# Patient Record
Sex: Male | Born: 1980 | Race: White | Hispanic: No | Smoking: Current every day smoker
Health system: Southern US, Community
[De-identification: ages and names within clinical notes are randomized; demographics above are authoritative.]

## PROBLEM LIST (undated history)

## (undated) DIAGNOSIS — F410 Panic disorder [episodic paroxysmal anxiety] without agoraphobia: Secondary | ICD-10-CM

## (undated) DIAGNOSIS — M1990 Unspecified osteoarthritis, unspecified site: Secondary | ICD-10-CM

## (undated) DIAGNOSIS — F1994 Other psychoactive substance use, unspecified with psychoactive substance-induced mood disorder: Secondary | ICD-10-CM

## (undated) DIAGNOSIS — F319 Bipolar disorder, unspecified: Secondary | ICD-10-CM

## (undated) DIAGNOSIS — K257 Chronic gastric ulcer without hemorrhage or perforation: Secondary | ICD-10-CM

## (undated) DIAGNOSIS — G8929 Other chronic pain: Secondary | ICD-10-CM

## (undated) DIAGNOSIS — F32A Depression, unspecified: Secondary | ICD-10-CM

## (undated) DIAGNOSIS — F329 Major depressive disorder, single episode, unspecified: Secondary | ICD-10-CM

## (undated) DIAGNOSIS — F419 Anxiety disorder, unspecified: Secondary | ICD-10-CM

## (undated) DIAGNOSIS — M549 Dorsalgia, unspecified: Secondary | ICD-10-CM

## (undated) HISTORY — PX: OTHER SURGICAL HISTORY: SHX169

---

## 1998-05-29 ENCOUNTER — Emergency Department (HOSPITAL_COMMUNITY): Admission: EM | Admit: 1998-05-29 | Discharge: 1998-05-29 | Payer: Self-pay | Admitting: Emergency Medicine

## 1998-07-25 ENCOUNTER — Emergency Department (HOSPITAL_COMMUNITY): Admission: EM | Admit: 1998-07-25 | Discharge: 1998-07-25 | Payer: Self-pay | Admitting: Emergency Medicine

## 2000-05-16 ENCOUNTER — Encounter: Payer: Self-pay | Admitting: Family Medicine

## 2000-05-16 ENCOUNTER — Encounter: Admission: RE | Admit: 2000-05-16 | Discharge: 2000-05-16 | Payer: Self-pay | Admitting: Family Medicine

## 2003-04-26 ENCOUNTER — Emergency Department (HOSPITAL_COMMUNITY): Admission: EM | Admit: 2003-04-26 | Discharge: 2003-04-26 | Payer: Self-pay | Admitting: Emergency Medicine

## 2003-05-29 ENCOUNTER — Emergency Department (HOSPITAL_COMMUNITY): Admission: EM | Admit: 2003-05-29 | Discharge: 2003-05-29 | Payer: Self-pay | Admitting: Emergency Medicine

## 2003-11-15 ENCOUNTER — Emergency Department (HOSPITAL_COMMUNITY): Admission: EM | Admit: 2003-11-15 | Discharge: 2003-11-15 | Payer: Self-pay | Admitting: Emergency Medicine

## 2003-12-30 ENCOUNTER — Emergency Department (HOSPITAL_COMMUNITY): Admission: EM | Admit: 2003-12-30 | Discharge: 2003-12-31 | Payer: Self-pay | Admitting: Emergency Medicine

## 2004-01-10 ENCOUNTER — Emergency Department (HOSPITAL_COMMUNITY): Admission: EM | Admit: 2004-01-10 | Discharge: 2004-01-10 | Payer: Self-pay | Admitting: Emergency Medicine

## 2004-01-21 ENCOUNTER — Encounter: Admission: RE | Admit: 2004-01-21 | Discharge: 2004-01-21 | Payer: Self-pay | Admitting: Psychiatry

## 2004-01-21 ENCOUNTER — Emergency Department (HOSPITAL_COMMUNITY): Admission: EM | Admit: 2004-01-21 | Discharge: 2004-01-21 | Payer: Self-pay | Admitting: Family Medicine

## 2004-01-31 ENCOUNTER — Emergency Department (HOSPITAL_COMMUNITY): Admission: EM | Admit: 2004-01-31 | Discharge: 2004-01-31 | Payer: Self-pay | Admitting: Emergency Medicine

## 2004-02-07 ENCOUNTER — Emergency Department (HOSPITAL_COMMUNITY): Admission: EM | Admit: 2004-02-07 | Discharge: 2004-02-07 | Payer: Self-pay | Admitting: Emergency Medicine

## 2004-02-25 ENCOUNTER — Ambulatory Visit (HOSPITAL_COMMUNITY): Payer: Self-pay | Admitting: Psychiatry

## 2004-03-01 ENCOUNTER — Ambulatory Visit (HOSPITAL_COMMUNITY): Payer: Self-pay | Admitting: Professional Counselor

## 2004-03-08 ENCOUNTER — Ambulatory Visit (HOSPITAL_COMMUNITY): Payer: Self-pay | Admitting: Professional Counselor

## 2004-03-22 ENCOUNTER — Ambulatory Visit (HOSPITAL_COMMUNITY): Payer: Self-pay | Admitting: Professional Counselor

## 2004-03-29 ENCOUNTER — Ambulatory Visit (HOSPITAL_COMMUNITY): Payer: Self-pay | Admitting: Psychiatry

## 2004-05-02 ENCOUNTER — Emergency Department (HOSPITAL_COMMUNITY): Admission: EM | Admit: 2004-05-02 | Discharge: 2004-05-02 | Payer: Self-pay | Admitting: Emergency Medicine

## 2005-11-07 ENCOUNTER — Emergency Department (HOSPITAL_COMMUNITY): Admission: EM | Admit: 2005-11-07 | Discharge: 2005-11-07 | Payer: Self-pay | Admitting: Emergency Medicine

## 2007-04-02 ENCOUNTER — Emergency Department (HOSPITAL_COMMUNITY): Admission: EM | Admit: 2007-04-02 | Discharge: 2007-04-02 | Payer: Self-pay | Admitting: Emergency Medicine

## 2007-04-18 ENCOUNTER — Emergency Department (HOSPITAL_COMMUNITY): Admission: EM | Admit: 2007-04-18 | Discharge: 2007-04-18 | Payer: Self-pay | Admitting: Emergency Medicine

## 2007-04-21 ENCOUNTER — Encounter (INDEPENDENT_AMBULATORY_CARE_PROVIDER_SITE_OTHER): Payer: Self-pay | Admitting: Nurse Practitioner

## 2007-04-21 DIAGNOSIS — E119 Type 2 diabetes mellitus without complications: Secondary | ICD-10-CM | POA: Insufficient documentation

## 2007-04-21 DIAGNOSIS — F411 Generalized anxiety disorder: Secondary | ICD-10-CM | POA: Insufficient documentation

## 2007-04-21 LAB — CONVERTED CEMR LAB
AST: 17 units/L
Alkaline Phosphatase: 52 units/L
BUN: 20 mg/dL
Cholesterol: 203 mg/dL
Creatinine, Ser: 0.89 mg/dL
HDL: 40 mg/dL
LDL Cholesterol: 139 mg/dL
Platelets: 326 10*3/uL
Potassium: 4.9 meq/L
RDW: 14.1 %
TSH: 3.447 microintl units/mL
Total Bilirubin: 0.4 mg/dL

## 2007-07-07 ENCOUNTER — Emergency Department (HOSPITAL_COMMUNITY): Admission: EM | Admit: 2007-07-07 | Discharge: 2007-07-07 | Payer: Self-pay | Admitting: Emergency Medicine

## 2007-08-15 DIAGNOSIS — S53449A Ulnar collateral ligament sprain of unspecified elbow, initial encounter: Secondary | ICD-10-CM

## 2007-09-08 ENCOUNTER — Emergency Department (HOSPITAL_COMMUNITY): Admission: EM | Admit: 2007-09-08 | Discharge: 2007-09-08 | Payer: Self-pay | Admitting: Emergency Medicine

## 2007-09-10 ENCOUNTER — Emergency Department (HOSPITAL_COMMUNITY): Admission: EM | Admit: 2007-09-10 | Discharge: 2007-09-10 | Payer: Self-pay | Admitting: Emergency Medicine

## 2007-09-30 ENCOUNTER — Emergency Department (HOSPITAL_COMMUNITY): Admission: EM | Admit: 2007-09-30 | Discharge: 2007-09-30 | Payer: Self-pay | Admitting: Emergency Medicine

## 2007-10-08 ENCOUNTER — Ambulatory Visit: Payer: Self-pay | Admitting: Nurse Practitioner

## 2007-10-08 DIAGNOSIS — M719 Bursopathy, unspecified: Secondary | ICD-10-CM

## 2007-10-08 DIAGNOSIS — Z8669 Personal history of other diseases of the nervous system and sense organs: Secondary | ICD-10-CM | POA: Insufficient documentation

## 2007-10-08 DIAGNOSIS — M67919 Unspecified disorder of synovium and tendon, unspecified shoulder: Secondary | ICD-10-CM | POA: Insufficient documentation

## 2007-10-09 ENCOUNTER — Encounter (INDEPENDENT_AMBULATORY_CARE_PROVIDER_SITE_OTHER): Payer: Self-pay | Admitting: Nurse Practitioner

## 2007-10-10 DIAGNOSIS — F341 Dysthymic disorder: Secondary | ICD-10-CM

## 2007-10-14 ENCOUNTER — Encounter (INDEPENDENT_AMBULATORY_CARE_PROVIDER_SITE_OTHER): Payer: Self-pay | Admitting: Nurse Practitioner

## 2007-10-22 ENCOUNTER — Telehealth (INDEPENDENT_AMBULATORY_CARE_PROVIDER_SITE_OTHER): Payer: Self-pay | Admitting: Nurse Practitioner

## 2007-11-21 ENCOUNTER — Ambulatory Visit: Payer: Self-pay | Admitting: Internal Medicine

## 2007-11-21 DIAGNOSIS — S43499A Other sprain of unspecified shoulder joint, initial encounter: Secondary | ICD-10-CM

## 2007-11-21 DIAGNOSIS — S46819A Strain of other muscles, fascia and tendons at shoulder and upper arm level, unspecified arm, initial encounter: Secondary | ICD-10-CM

## 2008-03-16 ENCOUNTER — Emergency Department (HOSPITAL_COMMUNITY): Admission: EM | Admit: 2008-03-16 | Discharge: 2008-03-16 | Payer: Self-pay | Admitting: Emergency Medicine

## 2008-03-18 ENCOUNTER — Emergency Department (HOSPITAL_COMMUNITY): Admission: EM | Admit: 2008-03-18 | Discharge: 2008-03-18 | Payer: Self-pay | Admitting: Emergency Medicine

## 2008-06-30 ENCOUNTER — Emergency Department (HOSPITAL_COMMUNITY): Admission: EM | Admit: 2008-06-30 | Discharge: 2008-06-30 | Payer: Self-pay | Admitting: Emergency Medicine

## 2008-10-29 ENCOUNTER — Emergency Department (HOSPITAL_COMMUNITY): Admission: EM | Admit: 2008-10-29 | Discharge: 2008-10-29 | Payer: Self-pay | Admitting: Emergency Medicine

## 2009-01-09 ENCOUNTER — Emergency Department (HOSPITAL_COMMUNITY): Admission: EM | Admit: 2009-01-09 | Discharge: 2009-01-09 | Payer: Self-pay | Admitting: Emergency Medicine

## 2009-08-11 ENCOUNTER — Emergency Department (HOSPITAL_BASED_OUTPATIENT_CLINIC_OR_DEPARTMENT_OTHER): Admission: EM | Admit: 2009-08-11 | Discharge: 2009-08-11 | Payer: Self-pay | Admitting: Emergency Medicine

## 2009-08-16 ENCOUNTER — Emergency Department (HOSPITAL_BASED_OUTPATIENT_CLINIC_OR_DEPARTMENT_OTHER): Admission: EM | Admit: 2009-08-16 | Discharge: 2009-08-16 | Payer: Self-pay | Admitting: Emergency Medicine

## 2010-01-11 ENCOUNTER — Emergency Department (HOSPITAL_BASED_OUTPATIENT_CLINIC_OR_DEPARTMENT_OTHER): Admission: EM | Admit: 2010-01-11 | Discharge: 2010-01-11 | Payer: Self-pay | Admitting: Emergency Medicine

## 2010-03-06 ENCOUNTER — Ambulatory Visit: Payer: Self-pay | Admitting: Diagnostic Radiology

## 2010-03-06 ENCOUNTER — Emergency Department (HOSPITAL_BASED_OUTPATIENT_CLINIC_OR_DEPARTMENT_OTHER): Admission: EM | Admit: 2010-03-06 | Discharge: 2010-03-06 | Payer: Self-pay | Admitting: Emergency Medicine

## 2010-03-20 ENCOUNTER — Emergency Department (HOSPITAL_COMMUNITY): Admission: EM | Admit: 2010-03-20 | Discharge: 2010-03-20 | Payer: Self-pay | Admitting: Emergency Medicine

## 2010-03-30 ENCOUNTER — Emergency Department (HOSPITAL_BASED_OUTPATIENT_CLINIC_OR_DEPARTMENT_OTHER): Admission: EM | Admit: 2010-03-30 | Discharge: 2010-03-30 | Payer: Self-pay | Admitting: Emergency Medicine

## 2010-04-08 ENCOUNTER — Ambulatory Visit: Payer: Self-pay | Admitting: Diagnostic Radiology

## 2010-04-08 ENCOUNTER — Emergency Department (HOSPITAL_BASED_OUTPATIENT_CLINIC_OR_DEPARTMENT_OTHER): Admission: EM | Admit: 2010-04-08 | Discharge: 2010-04-08 | Payer: Self-pay | Admitting: Emergency Medicine

## 2010-04-08 ENCOUNTER — Emergency Department (HOSPITAL_COMMUNITY): Admission: EM | Admit: 2010-04-08 | Discharge: 2010-04-08 | Payer: Self-pay | Admitting: Emergency Medicine

## 2010-04-26 ENCOUNTER — Emergency Department (HOSPITAL_COMMUNITY)
Admission: EM | Admit: 2010-04-26 | Discharge: 2010-04-26 | Payer: Self-pay | Source: Home / Self Care | Admitting: Emergency Medicine

## 2010-04-29 ENCOUNTER — Emergency Department (HOSPITAL_COMMUNITY)
Admission: EM | Admit: 2010-04-29 | Discharge: 2010-04-29 | Payer: Self-pay | Source: Home / Self Care | Admitting: Emergency Medicine

## 2010-05-20 ENCOUNTER — Emergency Department (HOSPITAL_COMMUNITY)
Admission: EM | Admit: 2010-05-20 | Discharge: 2010-05-20 | Payer: Self-pay | Source: Home / Self Care | Admitting: Emergency Medicine

## 2010-05-25 ENCOUNTER — Emergency Department (HOSPITAL_COMMUNITY): Admission: EM | Admit: 2010-05-25 | Discharge: 2010-04-27 | Payer: Self-pay | Admitting: Emergency Medicine

## 2010-08-30 LAB — COMPREHENSIVE METABOLIC PANEL
ALT: 21 U/L (ref 0–53)
AST: 18 U/L (ref 0–37)
CO2: 26 mEq/L (ref 19–32)
Calcium: 9.3 mg/dL (ref 8.4–10.5)
Chloride: 106 mEq/L (ref 96–112)
GFR calc Af Amer: >60 mL/min (ref >60)
GFR calc non Af Amer: >60 mL/min (ref >60)
Potassium: 3.3 mEq/L — ABNORMAL LOW (ref 3.5–5.1)
Sodium: 137 mEq/L (ref 135–145)
Total Bilirubin: 0.6 mg/dL (ref 0.3–1.2)

## 2010-08-30 LAB — BASIC METABOLIC PANEL
CO2: 26 mEq/L (ref 19–32)
Calcium: 10 mg/dL (ref 8.4–10.5)
Creatinine, Ser: 1.1 mg/dL (ref 0.4–1.5)
GFR calc Af Amer: >60 mL/min (ref >60)

## 2010-08-30 LAB — URINALYSIS, ROUTINE W REFLEX MICROSCOPIC
Bilirubin Urine: NEGATIVE
Glucose, UA: NEGATIVE mg/dL
Hgb urine dipstick: NEGATIVE
Ketones, ur: NEGATIVE mg/dL
pH: 6 (ref 5.0–8.0)

## 2010-08-30 LAB — CBC
Hemoglobin: 15.3 g/dL (ref 13.0–17.0)
MCH: 34 pg (ref 26.0–34.0)
MCHC: 34.3 g/dL (ref 30.0–36.0)
Platelets: 296 10*3/uL (ref 150–400)
RBC: 4.53 MIL/uL (ref 4.22–5.81)
RBC: 4.71 MIL/uL (ref 4.22–5.81)
WBC: 11.5 10*3/uL — ABNORMAL HIGH (ref 4.0–10.5)

## 2010-08-30 LAB — POCT CARDIAC MARKERS

## 2010-08-30 LAB — DIFFERENTIAL
Eosinophils Absolute: 0.1 10*3/uL (ref 0.0–0.7)
Eosinophils Relative: 1 % (ref 0–5)
Lymphs Abs: 3.1 10*3/uL (ref 0.7–4.0)

## 2010-08-31 LAB — BASIC METABOLIC PANEL
BUN: 14 mg/dL (ref 6–23)
Chloride: 109 mEq/L (ref 96–112)
Glucose, Bld: 102 mg/dL — ABNORMAL HIGH (ref 70–99)
Potassium: 3.5 mEq/L (ref 3.5–5.1)

## 2010-08-31 LAB — URINALYSIS, ROUTINE W REFLEX MICROSCOPIC
Bilirubin Urine: NEGATIVE
Glucose, UA: 100 mg/dL — AB
Ketones, ur: 40 mg/dL — AB
pH: 7 (ref 5.0–8.0)

## 2010-12-21 ENCOUNTER — Other Ambulatory Visit (HOSPITAL_COMMUNITY): Payer: Self-pay | Admitting: Internal Medicine

## 2010-12-21 DIAGNOSIS — M545 Low back pain: Secondary | ICD-10-CM

## 2010-12-25 ENCOUNTER — Emergency Department (HOSPITAL_COMMUNITY)
Admission: EM | Admit: 2010-12-25 | Discharge: 2010-12-25 | Disposition: A | Discharge status: Home / Self Care | Payer: Self-pay | Attending: Emergency Medicine | Admitting: Emergency Medicine

## 2010-12-25 DIAGNOSIS — Z888 Allergy status to other drugs, medicaments and biological substances status: Secondary | ICD-10-CM | POA: Insufficient documentation

## 2010-12-25 DIAGNOSIS — M549 Dorsalgia, unspecified: Secondary | ICD-10-CM | POA: Insufficient documentation

## 2010-12-25 DIAGNOSIS — G8929 Other chronic pain: Secondary | ICD-10-CM | POA: Insufficient documentation

## 2010-12-25 DIAGNOSIS — T40605A Adverse effect of unspecified narcotics, initial encounter: Secondary | ICD-10-CM | POA: Insufficient documentation

## 2010-12-25 DIAGNOSIS — F411 Generalized anxiety disorder: Secondary | ICD-10-CM | POA: Insufficient documentation

## 2010-12-26 ENCOUNTER — Other Ambulatory Visit (HOSPITAL_COMMUNITY): Payer: Self-pay | Admitting: Internal Medicine

## 2010-12-26 ENCOUNTER — Ambulatory Visit (HOSPITAL_COMMUNITY)
Admission: RE | Admit: 2010-12-26 | Discharge: 2010-12-26 | Disposition: A | Discharge status: Home / Self Care | Payer: Self-pay | Source: Ambulatory Visit | Attending: Internal Medicine | Admitting: Internal Medicine

## 2010-12-26 DIAGNOSIS — R609 Edema, unspecified: Secondary | ICD-10-CM | POA: Insufficient documentation

## 2010-12-26 DIAGNOSIS — M545 Low back pain, unspecified: Secondary | ICD-10-CM | POA: Insufficient documentation

## 2010-12-26 DIAGNOSIS — M79609 Pain in unspecified limb: Secondary | ICD-10-CM | POA: Insufficient documentation

## 2010-12-26 MED ORDER — GADOBENATE DIMEGLUMINE 529 MG/ML IV SOLN
15.0000 mL | Freq: Once | INTRAVENOUS | Status: AC
  Administered 2010-12-26: 15 mL via INTRAVENOUS

## 2011-02-03 ENCOUNTER — Emergency Department (HOSPITAL_BASED_OUTPATIENT_CLINIC_OR_DEPARTMENT_OTHER)
Admission: EM | Admit: 2011-02-03 | Discharge: 2011-02-03 | Disposition: A | Discharge status: Home / Self Care | Payer: Self-pay | Attending: Emergency Medicine | Admitting: Emergency Medicine

## 2011-02-03 ENCOUNTER — Encounter: Payer: Self-pay | Admitting: *Deleted

## 2011-02-03 DIAGNOSIS — G8929 Other chronic pain: Secondary | ICD-10-CM | POA: Insufficient documentation

## 2011-02-03 DIAGNOSIS — F17200 Nicotine dependence, unspecified, uncomplicated: Secondary | ICD-10-CM | POA: Insufficient documentation

## 2011-02-03 DIAGNOSIS — M549 Dorsalgia, unspecified: Secondary | ICD-10-CM | POA: Insufficient documentation

## 2011-02-03 MED ORDER — KETOROLAC TROMETHAMINE 60 MG/2ML IM SOLN
INTRAMUSCULAR | Status: AC
  Administered 2011-02-03: 60 mg via INTRAMUSCULAR
  Filled 2011-02-03: qty 2

## 2011-02-03 MED ORDER — OXYCODONE-ACETAMINOPHEN 5-325 MG PO TABS: 2.0000 | ORAL_TABLET | Freq: Once | ORAL | Status: DC

## 2011-02-03 MED ORDER — OXYCODONE-ACETAMINOPHEN 5-325 MG PO TABS: 1.0000 | ORAL_TABLET | ORAL | Status: DC | PRN

## 2011-02-03 MED ORDER — DEXAMETHASONE SODIUM PHOSPHATE 10 MG/ML IJ SOLN
10.0000 mg | Freq: Once | INTRAMUSCULAR | Status: AC
  Administered 2011-02-03: 10 mg via INTRAVENOUS

## 2011-02-03 MED ORDER — HYDROCODONE-IBUPROFEN 7.5-200 MG PO TABS: 1.0000 | ORAL_TABLET | Freq: Four times a day (QID) | ORAL | Status: AC | PRN

## 2011-02-03 NOTE — ED Notes (Signed)
Pt improved with injections pain decreased ambulatory without difficulty,unable to tolerate tylenol in Percocet MD informed  Family requesting to speak with Dr Hyacinth Meeker. To Conv Room 1

## 2011-02-03 NOTE — ED Provider Notes (Addendum)
History   Scribed for Vida Roller, MD, the patient was seen in room MH07/MH07 . This chart was scribed by Desma Paganini. This patient's care was started at 9:33 PM .    [name]: [id number] Arrival date & time: 02/03/2011  9:06 PM  Chief Complaint  Patient presents with  . Back Pain   HPI Casey Mercer is a 30 y.o. male who presents to the Emergency Department complaining of 1 year of chronic back pain. The patient states he injured his back again at work and has been seeing a [facility] and neurologist however, he came into the ED today because he is fed up with the chronic pain. Pt gets some pain relief when lying flat on his back with hips flexed. He wears a back brace and treats pain with Aleve daily however w/ no relief.   HPI ELEMENTS:   Location: lumbar and perispinal  Onset: 1 year prior  Timing: cosntant  Severity: severe  Modifying factors: some pain relief when lying flat on back with hips flexed. Context:  as above     PAST MEDICAL HISTORY:  History reviewed. No pertinent past medical history.   PAST SURGICAL HISTORY:  History reviewed. No pertinent past surgical history.   MEDICATIONS:  Previous Medications   No medications on file     ALLERGIES:  Allergies as of 02/03/2011  . (Not on File)     FAMILY HISTORY:  No Pertinent Family History   History reviewed. No pertinent family history.   SOCIAL HISTORY: History  Substance Use Topics  . Smoking status: Current Some Day Smoker  . Smokeless tobacco: Not on file  . Alcohol Use: No      Review of Systems 10 Systems reviewed and are negative for acute change except as noted in the HPI.  Physical Exam  BP 122/77  Pulse 74  Temp(Src) 98.3 F (36.8 C) (Oral)  Resp 20  Ht 6\' 1"  (1.854 m)  Wt 170 lb (77.111 kg)  BMI 22.43 kg/m2  SpO2 100%  Physical Exam  Nursing note and vitals reviewed. Constitutional: He is oriented to person, place, and time. He appears well-developed and well-nourished.    HENT:  Head: Normocephalic and atraumatic.  Eyes: EOM are normal. Pupils are equal, round, and reactive to light.  Neck: Normal range of motion. Neck supple.  Cardiovascular: Normal rate, normal heart sounds and intact distal pulses.   Pulmonary/Chest: Effort normal and breath sounds normal. No respiratory distress.  Abdominal: Bowel sounds are normal. He exhibits no distension. There is no tenderness.  Musculoskeletal: Normal range of motion. He exhibits tenderness (mild tenderness in lumbar and perispinal region). He exhibits no edema.  Neurological: He is alert and oriented to person, place, and time. No cranial nerve deficit.       Normal motor  Skin: Skin is warm and dry. No rash noted. He is not diaphoretic.  Psychiatric: He has a normal mood and affect.    ED Course  Procedures  MDM The patient has no acute neurologic findings, tenderness in his lower back. He is allergic to Tylenol and has been given shots of both Decadron and Toradol here with some improvement. We'll discharge home. Has follow up with neurology and his chiropractor.      Vida Roller, MD 02/03/11 2245  I personally performed the services described in this documentation, which was scribed in my presence. The recorded information has been reviewed and considered. Eber Hong MD    Vida Roller,  MD 02/03/11 2246  Vida Roller, MD 02/03/11 [phone]

## 2011-02-03 NOTE — ED Notes (Signed)
Pt states he injured his back at work 3 wks ago. Seeing Chiropractor while waiting for neurologist. Taking Aleve/Ibuprofen without relief.

## 2011-02-08 ENCOUNTER — Encounter (HOSPITAL_BASED_OUTPATIENT_CLINIC_OR_DEPARTMENT_OTHER): Payer: Self-pay | Admitting: Family Medicine

## 2011-02-08 ENCOUNTER — Emergency Department (HOSPITAL_BASED_OUTPATIENT_CLINIC_OR_DEPARTMENT_OTHER)
Admission: EM | Admit: 2011-02-08 | Discharge: 2011-02-08 | Disposition: A | Discharge status: Home / Self Care | Payer: Self-pay | Attending: Emergency Medicine | Admitting: Emergency Medicine

## 2011-02-08 DIAGNOSIS — M545 Low back pain, unspecified: Secondary | ICD-10-CM | POA: Insufficient documentation

## 2011-02-08 DIAGNOSIS — F17200 Nicotine dependence, unspecified, uncomplicated: Secondary | ICD-10-CM | POA: Insufficient documentation

## 2011-02-08 HISTORY — DX: Dorsalgia, unspecified: M54.9

## 2011-02-08 MED ORDER — OXYCODONE HCL 5 MG PO CAPS: 5.0000 mg | ORAL_CAPSULE | ORAL | Status: AC | PRN

## 2011-02-08 MED ORDER — ONDANSETRON HCL 4 MG/2ML IJ SOLN
4.0000 mg | Freq: Once | INTRAMUSCULAR | Status: AC
  Administered 2011-02-08: 4 mg via INTRAMUSCULAR
  Filled 2011-02-08: qty 2

## 2011-02-08 MED ORDER — MORPHINE SULFATE 10 MG/ML IJ SOLN
10.0000 mg | Freq: Once | INTRAMUSCULAR | Status: AC
  Administered 2011-02-08: 10 mg via INTRAMUSCULAR
  Filled 2011-02-08: qty 1

## 2011-02-08 NOTE — ED Provider Notes (Signed)
History     [name]: [id number] Arrival date & time: 02/08/2011  3:44 PM  Chief Complaint  Patient presents with  . Back Pain   HPI Comments: Patient says he hurt his back a year ago. It seemed to heal over time. About a month ago he hurt it again. He has been seeing a Land. He was seen recently at [facility] [place] [facility], and was advised to followup with either a neurologist or his chiropractor. Review of old records shows several prior visits for back pain. He reports that he saw his chiropractor this morning, and had been advised to see Dr. Murray Hodgkins for further medical evaluation.  Patient is a 30 y.o. male presenting with back pain. The history is provided by the patient and medical records. No language interpreter was used.  Back Pain  This is a recurrent problem. The current episode started more than 1 week ago. The problem occurs constantly. The problem has been gradually worsening. The pain is associated with lifting heavy objects. The pain is present in the lumbar spine. The quality of the pain is described as stabbing. The pain does not radiate. The pain is at a severity of 10/10. The pain is severe. The symptoms are aggravated by bending and twisting. Treatments tried: He had chiropractic manipulation, and has taken Aleve without relief.    Past Medical History  Diagnosis Date  . Back pain     History reviewed. No pertinent past surgical history.  No family history on file.  History  Substance Use Topics  . Smoking status: Current Some Day Smoker  . Smokeless tobacco: Not on file  . Alcohol Use: No      Review of Systems  Musculoskeletal: Positive for back pain.  All other systems reviewed and are negative.    Physical Exam  BP 135/82  Pulse 88  Temp(Src) 97.7 F (36.5 C) (Oral)  Resp 18  Ht 6\' 1"  (1.854 m)  Wt 170 lb (77.111 kg)  BMI 22.43 kg/m2  SpO2 100%  Physical Exam  Nursing note and vitals reviewed. Constitutional: He appears well-developed and  well-nourished. Distressed: in moderate to severe distress with low back pain.  HENT:  Head: Normocephalic and atraumatic.  Eyes: EOM are normal. Pupils are equal, round, and reactive to light.  Neck: Normal range of motion. Neck supple.  Cardiovascular: Normal rate and regular rhythm.   Pulmonary/Chest: Effort normal and breath sounds normal.  Abdominal: Soft. There is no tenderness.  Musculoskeletal:       He localizes pain to the lower lumbar and sacral regions. There is no palpable bony deformity or point tenderness on exam.  Lymphadenopathy:    He has no cervical adenopathy.  Neurological:       No sensory or motor deficits.  Skin: Skin is warm and dry.  Psychiatric: He has a normal mood and affect. His behavior is normal.    ED Course  Procedures  Course in ED: Patient was seen and had physical exam. Old charts were reviewed. The [place] [facility] has database was queried, he has had multiple restrictions for Valium 10 mg, with occasional prescriptions for Vicodin and Percocet. I advised him that he needed to either see Dr. Murray Hodgkins, or another pain management specialist. He was treated with morphine 10 mg and Zofran 4 mg intramuscularly. He was prescribed oxycodone 5 mg every 4-6 hours as needed for pain.    Casey Cooper III, MD 02/08/11 [phone]

## 2011-02-08 NOTE — ED Notes (Signed)
Pt c/o left low back pain since injury "over a year ago". Pt sts he was seen here Tuesday for back pain and was evaluated by a chiropractor today but pain is worse now.

## 2011-02-14 ENCOUNTER — Encounter (HOSPITAL_BASED_OUTPATIENT_CLINIC_OR_DEPARTMENT_OTHER): Payer: Self-pay | Admitting: *Deleted

## 2011-02-14 ENCOUNTER — Emergency Department (HOSPITAL_BASED_OUTPATIENT_CLINIC_OR_DEPARTMENT_OTHER)
Admission: EM | Admit: 2011-02-14 | Discharge: 2011-02-14 | Disposition: A | Discharge status: Home / Self Care | Payer: Self-pay | Attending: Emergency Medicine | Admitting: Emergency Medicine

## 2011-02-14 DIAGNOSIS — M549 Dorsalgia, unspecified: Secondary | ICD-10-CM | POA: Insufficient documentation

## 2011-02-14 DIAGNOSIS — F17200 Nicotine dependence, unspecified, uncomplicated: Secondary | ICD-10-CM | POA: Insufficient documentation

## 2011-02-14 DIAGNOSIS — G8929 Other chronic pain: Secondary | ICD-10-CM | POA: Insufficient documentation

## 2011-02-14 MED ORDER — MORPHINE SULFATE 4 MG/ML IJ SOLN
8.0000 mg | Freq: Once | INTRAMUSCULAR | Status: AC
  Administered 2011-02-14: 8 mg via INTRAMUSCULAR
  Filled 2011-02-14: qty 2

## 2011-02-14 MED ORDER — OXYCODONE HCL 5 MG PO CAPS: 5.0000 mg | ORAL_CAPSULE | ORAL | Status: AC | PRN

## 2011-02-14 NOTE — ED Notes (Signed)
PT seen here 3 times this month for same.  Appt canceled for neurology today. Pt c/o lower backpain

## 2011-02-14 NOTE — ED Provider Notes (Signed)
History     [name]: [id number] Arrival date & time: 02/14/2011  5:38 PM  Chief Complaint  Patient presents with  . Back Pain   HPI Comments: PT with chronic back pain, recently worse.  Had appt today with neurologist, they cancelled and recheduled for Tuesday.  Needs pain meds to get him to Tuesday.  No recent injury  Patient is a 30 y.o. male presenting with back pain. The history is provided by the patient.  Back Pain  This is a recurrent problem. The current episode started more than 1 week ago. The problem occurs constantly. The problem has been gradually worsening. The pain is associated with no known injury. The pain is present in the lumbar spine. The quality of the pain is described as aching and burning. The pain radiates to the right knee. The pain is severe. The symptoms are aggravated by twisting and bending. The pain is the same all the time. Pertinent negatives include no chest pain, no fever, no numbness, no headaches, no abdominal pain, no bladder incontinence, no dysuria, no paresthesias, no paresis and no weakness. He has tried analgesics and muscle relaxants for the symptoms.    Past Medical History  Diagnosis Date  . Back pain     History reviewed. No pertinent past surgical history.  History reviewed. No pertinent family history.  History  Substance Use Topics  . Smoking status: Current Some Day Smoker -- 1.0 packs/day  . Smokeless tobacco: Not on file  . Alcohol Use: No      Review of Systems  Constitutional: Negative for fever, chills, diaphoresis and fatigue.  HENT: Negative for congestion, rhinorrhea and sneezing.   Eyes: Negative.   Respiratory: Negative for cough, chest tightness and shortness of breath.   Cardiovascular: Negative for chest pain and leg swelling.  Gastrointestinal: Negative for nausea, vomiting, abdominal pain, diarrhea and blood in stool.  Genitourinary: Negative for bladder incontinence, dysuria, frequency, hematuria, flank pain and  difficulty urinating.  Musculoskeletal: Positive for back pain. Negative for arthralgias.  Skin: Negative for rash.  Neurological: Negative for dizziness, speech difficulty, weakness, numbness, headaches and paresthesias.    Physical Exam  BP 108/64  Pulse 65  Temp(Src) 98.3 F (36.8 C) (Oral)  Resp 18  Wt 158 lb (71.668 kg)  SpO2 100%  Physical Exam  Constitutional: He is oriented to person, place, and time. He appears well-developed and well-nourished.  HENT:  Head: Normocephalic and atraumatic.  Eyes: Pupils are equal, round, and reactive to light.  Neck: Normal range of motion. Neck supple.  Cardiovascular: Normal rate, regular rhythm and normal heart sounds.   Pulmonary/Chest: Effort normal and breath sounds normal. No respiratory distress. He has no wheezes. He has no rales. He exhibits no tenderness.  Abdominal: Soft. Bowel sounds are normal. There is no tenderness. There is no rebound and no guarding.  Musculoskeletal: Normal range of motion. He exhibits no edema.       Lumbar back: He exhibits tenderness. He exhibits no deformity and no spasm.  Lymphadenopathy:    He has no cervical adenopathy.  Neurological: He is alert and oriented to person, place, and time. He has normal strength. No sensory deficit.  Reflex Scores:      Patellar reflexes are 2+ on the right side and 2+ on the left side.      Negative SLR bilaterally  Skin: Skin is warm and dry. No rash noted.  Psychiatric: He has a normal mood and affect.    ED Course  Procedures  MDM  Will give shot here, small rx for pain meds.  Advised that he cannot continue to get pain management from ED      Rolan Bucco, MD 02/14/11 [phone]

## 2011-02-14 NOTE — ED Notes (Signed)
Pt states he is out of pain meds given on previous visit.

## 2011-02-18 ENCOUNTER — Other Ambulatory Visit: Payer: Self-pay | Admitting: Neurology

## 2011-02-18 DIAGNOSIS — M549 Dorsalgia, unspecified: Secondary | ICD-10-CM

## 2011-02-21 ENCOUNTER — Inpatient Hospital Stay: Admission: RE | Admit: 2011-02-21 | Payer: Self-pay | Source: Ambulatory Visit

## 2011-02-21 ENCOUNTER — Other Ambulatory Visit: Payer: Self-pay

## 2011-03-06 ENCOUNTER — Emergency Department (HOSPITAL_BASED_OUTPATIENT_CLINIC_OR_DEPARTMENT_OTHER)
Admission: EM | Admit: 2011-03-06 | Discharge: 2011-03-06 | Disposition: A | Discharge status: Home / Self Care | Payer: Self-pay | Attending: Emergency Medicine | Admitting: Emergency Medicine

## 2011-03-06 DIAGNOSIS — M549 Dorsalgia, unspecified: Secondary | ICD-10-CM | POA: Insufficient documentation

## 2011-03-06 DIAGNOSIS — F17200 Nicotine dependence, unspecified, uncomplicated: Secondary | ICD-10-CM | POA: Insufficient documentation

## 2011-03-06 MED ORDER — HYDROMORPHONE HCL 1 MG/ML IJ SOLN
2.0000 mg | Freq: Once | INTRAMUSCULAR | Status: AC
  Administered 2011-03-06: 2 mg via INTRAMUSCULAR
  Filled 2011-03-06: qty 2

## 2011-03-06 MED ORDER — KETOROLAC TROMETHAMINE 60 MG/2ML IM SOLN
60.0000 mg | Freq: Once | INTRAMUSCULAR | Status: AC
  Administered 2011-03-06: 60 mg via INTRAMUSCULAR
  Filled 2011-03-06: qty 2

## 2011-03-06 NOTE — ED Notes (Signed)
Warm Blanket given to pt.

## 2011-03-06 NOTE — ED Notes (Signed)
Ice pack given to pt.

## 2011-03-06 NOTE — ED Provider Notes (Signed)
History     [name]: [id number] Arrival date & time: 03/06/2011 12:07 PM   No chief complaint on file.    (Include location/radiation/quality/duration/timing/severity/associated sxs/prior treatment) Patient is a 30 y.o. male presenting with back pain. The history is provided by the patient. No language interpreter was used.  Back Pain  This is a recurrent problem. The problem has been gradually worsening. The pain is present in the lumbar spine. The quality of the pain is described as aching. The pain does not radiate. The pain is at a severity of 7/10. The pain is moderate. The pain is worse during the day. Stiffness is present in the morning. He has tried NSAIDs and muscle relaxants for the symptoms. The treatment provided moderate relief. Risk factors: none.  Pt complains of back pain.  Pt reports he has seen neurologist and has been told he needs steroid injections.   Past Medical History  Diagnosis Date  . Back pain      No past surgical history on file.  No family history on file.  History  Substance Use Topics  . Smoking status: Current Some Day Smoker -- 1.0 packs/day  . Smokeless tobacco: Not on file  . Alcohol Use: No      Review of Systems  Musculoskeletal: Positive for back pain.  All other systems reviewed and are negative.    Allergies  Benadryl; Nyquil; and Tylenol  Home Medications   Current Outpatient Rx  Name Route Sig Dispense Refill  . BUSPIRONE HCL 10 MG PO TABS Oral Take 10 mg by mouth 2 (two) times daily.      Marland Kitchen DIAZEPAM 10 MG PO TABS Oral Take 10 mg by mouth 3 (three) times daily as needed. anxiety     . MENTHOL (TOPICAL ANALGESIC) 10 % EX LIQD Apply externally Apply topically 2 (two) times daily.      Marland Kitchen ONE-DAILY MULTI VITAMINS PO TABS Oral Take 1 tablet by mouth daily.      Marland Kitchen NAPROXEN SODIUM 220 MG PO TABS Oral Take 880 mg by mouth 2 (two) times daily with a meal.     . VITAMIN C 500 MG PO TABS Oral Take 500 mg by mouth daily.         Physical Exam    There were no vitals taken for this visit.  Physical Exam  Nursing note and vitals reviewed. Constitutional: He appears well-developed and well-nourished.  HENT:  Head: Normocephalic.  Eyes: Pupils are equal, round, and reactive to light.  Neck: Normal range of motion.  Cardiovascular: Normal rate.   Pulmonary/Chest: Effort normal.  Abdominal: Soft.  Musculoskeletal:       Decreased range of motion low back,  Moves stiffly    ED Course  Procedures  Results for orders placed during the hospital encounter of 04/08/10  CBC      Component Value Range   WBC 11.5 (*) 4.0 - 10.5 (K/uL)   RBC 4.53  4.22 - 5.81 (MIL/uL)   Hemoglobin 15.3  13.0 - 17.0 (g/dL)   HCT 78.2  95.6 - 21.3 (%)   MCV 96.3  78.0 - 100.0 (fL)   MCH 33.8  26.0 - 34.0 (pg)   MCHC 35.1  30.0 - 36.0 (g/dL)   RDW 08.6  57.8 - 46.9 (%)   Platelets 279  150 - 400 (K/uL)  COMPREHENSIVE METABOLIC PANEL      Component Value Range   Sodium 137  135 - 145 (mEq/L)   Potassium 3.3 (*) 3.5 -  5.1 (mEq/L)   Chloride 106  96 - 112 (mEq/L)   CO2 26  19 - 32 (mEq/L)   Glucose, Bld 94  70 - 99 (mg/dL)   BUN 14  6 - 23 (mg/dL)   Creatinine, Ser 1.61  0.4 - 1.5 (mg/dL)   Calcium 9.3  8.4 - 09.6 (mg/dL)   Total Protein 6.6  6.0 - 8.3 (g/dL)   Albumin 4.3  3.5 - 5.2 (g/dL)   AST 18  0 - 37 (U/L)   ALT 21  0 - 53 (U/L)   Alkaline Phosphatase 41  39 - 117 (U/L)   Total Bilirubin 0.6  0.3 - 1.2 (mg/dL)   GFR calc non Af Amer >60  >60 (mL/min)   GFR calc Af Amer    >60 (mL/min)   Value: >60            The eGFR has been calculated     using the MDRD equation.     This calculation has not been     validated in all clinical     situations.     eGFR's persistently     <60 mL/min signify     possible Chronic Kidney Disease.  DIFFERENTIAL      Component Value Range   Neutrophils Relative 63  43 - 77 (%)   Neutro Abs 7.3  1.7 - 7.7 (K/uL)   Lymphocytes Relative 27  12 - 46 (%)   Lymphs Abs 3.1  0.7 -  4.0 (K/uL)   Monocytes Relative 10  3 - 12 (%)   Monocytes Absolute 1.1 (*) 0.1 - 1.0 (K/uL)   Eosinophils Relative 1  0 - 5 (%)   Eosinophils Absolute 0.1  0.0 - 0.7 (K/uL)   Basophils Relative 0  0 - 1 (%)   Basophils Absolute 0.0  0.0 - 0.1 (K/uL)   No results found.   No diagnosis found.   MDM Pt has multi ED visits       Langston Masker, [place] 03/06/11 1306

## 2011-03-06 NOTE — ED Notes (Signed)
Family at bedside. 

## 2011-03-08 NOTE — ED Provider Notes (Signed)
Medical screening examination/treatment/procedure(s) were performed by non-physician practitioner and as supervising physician I was immediately available for consultation/collaboration.  Cyndra Numbers, MD 03/08/11 [phone]

## 2011-03-15 ENCOUNTER — Emergency Department (HOSPITAL_BASED_OUTPATIENT_CLINIC_OR_DEPARTMENT_OTHER)
Admission: EM | Admit: 2011-03-15 | Discharge: 2011-03-15 | Disposition: A | Discharge status: Home / Self Care | Payer: Self-pay | Attending: Emergency Medicine | Admitting: Emergency Medicine

## 2011-03-15 ENCOUNTER — Encounter (HOSPITAL_BASED_OUTPATIENT_CLINIC_OR_DEPARTMENT_OTHER): Payer: Self-pay | Admitting: *Deleted

## 2011-03-15 DIAGNOSIS — R112 Nausea with vomiting, unspecified: Secondary | ICD-10-CM

## 2011-03-15 DIAGNOSIS — M549 Dorsalgia, unspecified: Secondary | ICD-10-CM | POA: Insufficient documentation

## 2011-03-15 DIAGNOSIS — G8929 Other chronic pain: Secondary | ICD-10-CM | POA: Insufficient documentation

## 2011-03-15 DIAGNOSIS — F17200 Nicotine dependence, unspecified, uncomplicated: Secondary | ICD-10-CM | POA: Insufficient documentation

## 2011-03-15 DIAGNOSIS — R197 Diarrhea, unspecified: Secondary | ICD-10-CM

## 2011-03-15 DIAGNOSIS — R1013 Epigastric pain: Secondary | ICD-10-CM

## 2011-03-15 NOTE — ED Provider Notes (Signed)
History     [name]: [id number] Arrival date & time: 03/15/2011 12:05 PM  Chief Complaint  Patient presents with  . Back Pain    (Consider location/radiation/quality/duration/timing/severity/associated sxs/prior treatment) HPI Comments: Pt states that he sees Dr. Anne Hahn and he recommended surgery:pt states that when he said that he couldn't afford surgery he was told to see pain management:pt states that he can't afford to see them either:pt is requesting to be admitted to have surgery:pt denies any new injury or new symptoms  Patient is a 30 y.o. male presenting with back pain. The history is provided by the patient. No language interpreter was used.  Back Pain  This is a chronic problem. The current episode started more than 1 week ago. The problem occurs constantly. The problem has not changed since onset.The pain is present in the sacro-iliac joint. The quality of the pain is described as aching. The pain radiates to the right thigh. The pain is moderate. The pain is the same all the time. Pertinent negatives include no bowel incontinence, no bladder incontinence, no dysuria, no paresthesias and no tingling.    Past Medical History  Diagnosis Date  . Back pain     History reviewed. No pertinent past surgical history.  History reviewed. No pertinent family history.  History  Substance Use Topics  . Smoking status: Current Some Day Smoker -- 1.0 packs/day  . Smokeless tobacco: Not on file  . Alcohol Use: No      Review of Systems  Gastrointestinal: Negative for bowel incontinence.  Genitourinary: Negative for bladder incontinence and dysuria.  Musculoskeletal: Positive for back pain.  Neurological: Negative for tingling and paresthesias.  All other systems reviewed and are negative.    Allergies  Benadryl; Nyquil; and Tylenol  Home Medications   Current Outpatient Rx  Name Route Sig Dispense Refill  . OXYCODONE HCL 10 MG PO TB12 Oral Take 10 mg by mouth every 12  (twelve) hours.      . BUSPIRONE HCL 10 MG PO TABS Oral Take 10 mg by mouth 2 (two) times daily.      Marland Kitchen DIAZEPAM 10 MG PO TABS Oral Take 10 mg by mouth 3 (three) times daily as needed. anxiety     . MENTHOL (TOPICAL ANALGESIC) 10 % EX LIQD Apply externally Apply topically 2 (two) times daily.      Marland Kitchen ONE-DAILY MULTI VITAMINS PO TABS Oral Take 1 tablet by mouth daily.      Marland Kitchen NAPROXEN SODIUM 220 MG PO TABS Oral Take 880 mg by mouth 2 (two) times daily with a meal.     . VITAMIN C 500 MG PO TABS Oral Take 500 mg by mouth daily.        BP 110/70  Pulse 78  Temp(Src) 98 F (36.7 C) (Oral)  Resp 18  SpO2 99%  Physical Exam  Nursing note and vitals reviewed. Constitutional: He appears well-developed and well-nourished.  HENT:  Head: Normocephalic.  Neck: Normal range of motion.  Cardiovascular: Normal rate and regular rhythm.   Pulmonary/Chest: Effort normal and breath sounds normal.  Musculoskeletal: Normal range of motion.       Thoracic back: Normal.       Lumbar back: He exhibits tenderness.       Pt has full rom:no deficits noted  Neurological: He is alert.  Skin: Skin is warm and dry.    ED Course  Procedures (including critical care time)  Labs Reviewed - No data to display No results found.  No diagnosis found.    MDM  Discussed with pt that he is going to have to go to pain management or Dr. Anne Hahn that we will not continue to treat as he has been to the er multiple times and it is a chronic pain problem        Teressa Lower, NP 03/15/11 1217

## 2011-03-15 NOTE — ED Notes (Signed)
Pt amb to room 4 with quick steady gait in nad. Pt reports he was dropped by his back doctor yesterday, was told he had to go to a pain management clinic and dr. Anne Hahn stated he would not give him any more rx for pain meds. Pt states he needs surgery and cannot afford it.

## 2011-03-15 NOTE — ED Provider Notes (Signed)
Medical screening examination/treatment/procedure(s) were performed by non-physician practitioner and as supervising physician I was immediately available for consultation/collaboration.   Dayton Bailiff, MD 03/15/11 1228

## 2011-03-19 LAB — DIFFERENTIAL
Basophils Relative: 1
Eosinophils Relative: 0
Lymphocytes Relative: 38
Monocytes Relative: 25 — ABNORMAL HIGH
Neutro Abs: 1.4 — ABNORMAL LOW

## 2011-03-19 LAB — HEPATIC FUNCTION PANEL
ALT: 17
AST: 20
Alkaline Phosphatase: 40
Bilirubin, Direct: 0.1
Total Bilirubin: 0.7

## 2011-03-19 LAB — BASIC METABOLIC PANEL
Chloride: 111
GFR calc non Af Amer: >60
Glucose, Bld: 92
Potassium: 3.7
Sodium: 141

## 2011-03-19 LAB — CBC
HCT: 43.7
Hemoglobin: 15.1
MCV: 96
WBC: 3.8 — ABNORMAL LOW

## 2011-03-23 ENCOUNTER — Emergency Department (HOSPITAL_COMMUNITY)
Admission: EM | Admit: 2011-03-23 | Discharge: 2011-03-23 | Disposition: A | Discharge status: Home / Self Care | Payer: Self-pay | Attending: Emergency Medicine | Admitting: Emergency Medicine

## 2011-03-23 DIAGNOSIS — G8929 Other chronic pain: Secondary | ICD-10-CM | POA: Insufficient documentation

## 2011-03-23 DIAGNOSIS — M549 Dorsalgia, unspecified: Secondary | ICD-10-CM | POA: Insufficient documentation

## 2011-03-23 DIAGNOSIS — R000 Tachycardia, unspecified: Secondary | ICD-10-CM | POA: Insufficient documentation

## 2011-03-23 DIAGNOSIS — F411 Generalized anxiety disorder: Secondary | ICD-10-CM | POA: Insufficient documentation

## 2011-03-28 LAB — DIFFERENTIAL
Basophils Relative: 0
Monocytes Absolute: 1 — ABNORMAL HIGH
Monocytes Relative: 11
Neutro Abs: 5.1

## 2011-03-28 LAB — BASIC METABOLIC PANEL
BUN: 14
CO2: 26
Calcium: 10.1
Creatinine, Ser: 0.85
Glucose, Bld: 97
Sodium: 142

## 2011-03-28 LAB — HEPATIC FUNCTION PANEL
ALT: 24
AST: 15
Bilirubin, Direct: <0.1
Total Bilirubin: 1

## 2011-03-28 LAB — CBC
Hemoglobin: 15.3
MCHC: 35.2
RBC: 4.65

## 2011-03-28 LAB — POCT CARDIAC MARKERS: CKMB, poc: <1 — ABNORMAL LOW

## 2011-06-20 ENCOUNTER — Encounter (HOSPITAL_BASED_OUTPATIENT_CLINIC_OR_DEPARTMENT_OTHER): Payer: Self-pay | Admitting: Emergency Medicine

## 2011-06-20 ENCOUNTER — Emergency Department (HOSPITAL_BASED_OUTPATIENT_CLINIC_OR_DEPARTMENT_OTHER)
Admission: EM | Admit: 2011-06-20 | Discharge: 2011-06-20 | Disposition: A | Discharge status: Home / Self Care | Payer: Self-pay | Attending: Emergency Medicine | Admitting: Emergency Medicine

## 2011-06-20 DIAGNOSIS — G8929 Other chronic pain: Secondary | ICD-10-CM | POA: Insufficient documentation

## 2011-06-20 DIAGNOSIS — Z79899 Other long term (current) drug therapy: Secondary | ICD-10-CM | POA: Insufficient documentation

## 2011-06-20 DIAGNOSIS — F17200 Nicotine dependence, unspecified, uncomplicated: Secondary | ICD-10-CM | POA: Insufficient documentation

## 2011-06-20 DIAGNOSIS — F341 Dysthymic disorder: Secondary | ICD-10-CM | POA: Insufficient documentation

## 2011-06-20 DIAGNOSIS — M549 Dorsalgia, unspecified: Secondary | ICD-10-CM | POA: Insufficient documentation

## 2011-06-20 HISTORY — DX: Anxiety disorder, unspecified: F41.9

## 2011-06-20 HISTORY — DX: Chronic gastric ulcer without hemorrhage or perforation: K25.7

## 2011-06-20 HISTORY — DX: Depression, unspecified: F32.A

## 2011-06-20 HISTORY — DX: Major depressive disorder, single episode, unspecified: F32.9

## 2011-06-20 MED ORDER — OXYCODONE HCL 5 MG PO TABS: 10.0000 mg | ORAL_TABLET | Freq: Four times a day (QID) | ORAL | Status: AC | PRN

## 2011-06-20 MED ORDER — HYDROMORPHONE HCL PF 2 MG/ML IJ SOLN
2.0000 mg | Freq: Once | INTRAMUSCULAR | Status: AC
  Administered 2011-06-20: 2 mg via INTRAMUSCULAR
  Filled 2011-06-20: qty 1

## 2011-06-20 MED ORDER — OXYCODONE-ACETAMINOPHEN 5-325 MG PO TABS: 2.0000 | ORAL_TABLET | Freq: Four times a day (QID) | ORAL | Status: DC | PRN

## 2011-06-20 NOTE — ED Provider Notes (Signed)
History     [name]: [id number]  Arrival date & time 06/20/11  1047   First MD Initiated Contact with Patient 06/20/11 1104      Chief Complaint  Patient presents with  . Back Pain    (Consider location/radiation/quality/duration/timing/severity/associated sxs/prior treatment) HPI Patient is a 31 year old male with a history of chronic back pain and presents today complaining of same. His [facility]'s been being [facility]. Patient has also been working with health service to be referred to [facility] for further evaluation for possible surgical intervention.  Patient denies any incontinence or new neurologic symptoms.  He has no urinary symptoms.  Patient reports that this is just his chronic pain but that he has tried to follow the avenues provided and unfortunately has hit a recent road block since his doctor left his practice.  Patient has no other associated or modifying factors.  Pain today is 10 out of 10 and sharp. Past Medical History  Diagnosis Date  . Back pain   . Gastric ulcer, chronic   . Depression   . Anxiety     History reviewed. No pertinent past surgical history.  History reviewed. No pertinent family history.  History  Substance Use Topics  . Smoking status: Current Some Day Smoker -- 1.0 packs/day for 15 years    Types: Cigarettes  . Smokeless tobacco: Not on file  . Alcohol Use: No      Review of Systems  Constitutional: Negative.   HENT: Negative.   Eyes: Negative.   Respiratory: Negative.   Cardiovascular: Negative.   Gastrointestinal: Negative.   Genitourinary: Negative.   Musculoskeletal: Positive for back pain.  Skin: Negative.   Neurological: Negative.   Hematological: Negative.   Psychiatric/Behavioral: Negative.   All other systems reviewed and are negative.    Allergies  Benadryl; Nyquil; and Tylenol  Home Medications   Current Outpatient Rx  Name Route Sig Dispense Refill  . OXYCODONE HCL  ER 10 MG PO TB12 Oral Take 10 mg by mouth every 12 (twelve) hours.      . BUSPIRONE HCL 10 MG PO TABS Oral Take 10 mg by mouth 2 (two) times daily.      Marland Kitchen DIAZEPAM 10 MG PO TABS Oral Take 10 mg by mouth 3 (three) times daily as needed. anxiety     . MENTHOL (TOPICAL ANALGESIC) 10 % EX LIQD Apply externally Apply topically 2 (two) times daily.      Marland Kitchen ONE-DAILY MULTI VITAMINS PO TABS Oral Take 1 tablet by mouth daily.      Marland Kitchen NAPROXEN SODIUM 220 MG PO TABS Oral Take 880 mg by mouth 2 (two) times daily with a meal.     . OXYCODONE HCL 5 MG PO TABS Oral Take 2 tablets (10 mg total) by mouth every 6 (six) hours as needed for pain. 30 tablet 0  . VITAMIN C 500 MG PO TABS Oral Take 500 mg by mouth daily.        BP 114/79  Pulse 78  Temp(Src) 97.9 F (36.6 C) (Oral)  Resp 20  Ht 6' (1.829 m)  Wt 160 lb (72.576 kg)  BMI 21.70 kg/m2  SpO2 99%  Physical Exam  Nursing note reviewed. Constitutional: He is oriented to person, place, and time. He appears well-developed and well-nourished.       uncomfortable  HENT:  Head: Normocephalic and atraumatic.  Eyes: Conjunctivae and EOM are normal. Pupils are equal, round, and reactive to  light.  Neck: Normal range of motion.  Cardiovascular: Normal rate, regular rhythm, normal heart sounds and intact distal pulses.  Exam reveals no gallop and no friction rub.   No murmur heard. Pulmonary/Chest: Effort normal. No respiratory distress. He has no wheezes. He has no rales.  Abdominal: Soft. Bowel sounds are normal. He exhibits no distension. There is no tenderness. There is no rebound and no guarding.  Musculoskeletal: He exhibits tenderness.       Lumbar spine  Neurological: He is alert and oriented to person, place, and time. No cranial nerve deficit. He exhibits normal muscle tone. Coordination normal.       Antalgic gait  Skin: Skin is warm and dry. No rash noted.  Psychiatric: He has a normal mood and affect.    ED Course  Procedures (including  critical care time)  Labs Reviewed - No data to display No results found.   1. Chronic back pain       MDM  Patient was evaluated.  He does not require additional work-up for this chronic problem today.  He was advised that he must follow-up with his PCP.  He was given a shot of IM dilaudid and a prescription for 30 tabs of oxycodone.  He was advised to make a follow-up appointment with his PCP today.  Patient was discharged in improved condition.       Cyndra Numbers, MD 06/20/11 1556

## 2011-06-20 NOTE — ED Notes (Signed)
Patient states he has chronic back pain and is waiting for surgery at [facility].  States he is on a waiting list.  States he over did his activities over the last week, and now has mid back radiating down bilateral legs to his ankles.  States he is taking aleve and his regular medication with no relief.

## 2011-07-23 ENCOUNTER — Encounter (HOSPITAL_BASED_OUTPATIENT_CLINIC_OR_DEPARTMENT_OTHER): Payer: Self-pay | Admitting: *Deleted

## 2011-07-23 ENCOUNTER — Emergency Department (HOSPITAL_BASED_OUTPATIENT_CLINIC_OR_DEPARTMENT_OTHER)
Admission: EM | Admit: 2011-07-23 | Discharge: 2011-07-23 | Disposition: A | Discharge status: Home / Self Care | Payer: Self-pay | Attending: Emergency Medicine | Admitting: Emergency Medicine

## 2011-07-23 DIAGNOSIS — F17200 Nicotine dependence, unspecified, uncomplicated: Secondary | ICD-10-CM | POA: Insufficient documentation

## 2011-07-23 DIAGNOSIS — Z79899 Other long term (current) drug therapy: Secondary | ICD-10-CM | POA: Insufficient documentation

## 2011-07-23 DIAGNOSIS — F341 Dysthymic disorder: Secondary | ICD-10-CM | POA: Insufficient documentation

## 2011-07-23 DIAGNOSIS — M549 Dorsalgia, unspecified: Secondary | ICD-10-CM | POA: Insufficient documentation

## 2011-07-23 MED ORDER — HYDROMORPHONE HCL PF 2 MG/ML IJ SOLN
2.0000 mg | Freq: Once | INTRAMUSCULAR | Status: AC
  Administered 2011-07-23: 2 mg via INTRAMUSCULAR
  Filled 2011-07-23: qty 1

## 2011-07-23 MED ORDER — OXYCODONE HCL 5 MG PO CAPS: 5.0000 mg | ORAL_CAPSULE | ORAL | Status: AC | PRN

## 2011-07-23 MED ORDER — ONDANSETRON HCL 4 MG/2ML IJ SOLN
4.0000 mg | Freq: Once | INTRAMUSCULAR | Status: AC
  Administered 2011-07-23: 4 mg via INTRAMUSCULAR
  Filled 2011-07-23: qty 2

## 2011-07-23 NOTE — ED Notes (Signed)
Lower back pain 

## 2011-07-23 NOTE — ED Notes (Signed)
PA in to evaluate pt now. 

## 2011-07-23 NOTE — ED Provider Notes (Signed)
History     [name]: [id number]  Arrival date & time 07/23/11  1712   First MD Initiated Contact with Patient 07/23/11 1932      Chief Complaint  Patient presents with  . Back Pain    (Consider location/radiation/quality/duration/timing/severity/associated sxs/prior treatment) Patient is a 31 y.o. male presenting with back pain. The history is provided by the patient. No language interpreter was used.  Back Pain  This is a new problem. The current episode started 6 to 12 hours ago. The problem occurs constantly. The problem has been gradually improving. Associated with: chronic. The pain is present in the lumbar spine. The quality of the pain is described as aching. The pain is at a severity of 7/10. The pain is moderate. The symptoms are aggravated by bending and twisting. The pain is the same all the time. Stiffness is present all day. He has tried analgesics for the symptoms. The treatment provided no relief. Risk factors: hx of back pain.  Pt complains of a flare up of back pain.  Pt reports [facility].  Pt reports new MD there.  Past Medical History  Diagnosis Date  . Back pain   . Gastric ulcer, chronic   . Depression   . Anxiety     History reviewed. No pertinent past surgical history.  No family history on file.  History  Substance Use Topics  . Smoking status: Current Some Day Smoker -- 1.0 packs/day for 15 years    Types: Cigarettes  . Smokeless tobacco: Not on file  . Alcohol Use: No      Review of Systems  Musculoskeletal: Positive for back pain.  All other systems reviewed and are negative.    Allergies  Benadryl; Nyquil; and Tylenol  Home Medications   Current Outpatient Rx  Name Route Sig Dispense Refill  . BUSPIRONE HCL 10 MG PO TABS Oral Take 10 mg by mouth 2 (two) times daily.      Marland Kitchen DIAZEPAM 10 MG PO TABS Oral Take 10 mg by mouth 3 (three) times daily as needed. anxiety     . MENTHOL (TOPICAL ANALGESIC) 10 % EX LIQD Apply  externally Apply topically 2 (two) times daily.      Marland Kitchen ONE-DAILY MULTI VITAMINS PO TABS Oral Take 1 tablet by mouth daily.      Marland Kitchen NAPROXEN SODIUM 220 MG PO TABS Oral Take 880 mg by mouth 2 (two) times daily with a meal.     . OXYCODONE HCL ER 10 MG PO TB12 Oral Take 10 mg by mouth every 12 (twelve) hours.      Marland Kitchen VITAMIN C 500 MG PO TABS Oral Take 500 mg by mouth daily.        BP 134/80  Pulse 88  Temp(Src) 98.6 F (37 C) (Oral)  Resp 26  SpO2 100%  Physical Exam  Nursing note and vitals reviewed. Constitutional: He appears well-developed and well-nourished.  HENT:  Head: Normocephalic and atraumatic.  Eyes: Pupils are equal, round, and reactive to light.  Neck: Normal range of motion.  Cardiovascular: Normal rate.   Pulmonary/Chest: Effort normal and breath sounds normal.  Abdominal: Soft.  Musculoskeletal: Normal range of motion.  Neurological: He is alert.  Skin: Skin is warm.    ED Course  Procedures (including critical care time)  Labs Reviewed - No data to display No results found.   No diagnosis found.    MDM  Pt given shot of dilaudid and zofran.  Pt  advised to follow up at [facility] for evaluation.  Pt given Rx for oxycodone.        Langston Masker, [place] 07/23/11 1956

## 2011-07-23 NOTE — ED Provider Notes (Signed)
Medical screening examination/treatment/procedure(s) were performed by non-physician practitioner and as supervising physician I was immediately available for consultation/collaboration. Dany Walther Y.   Carman Auxier Y. Elvera Almario, MD 07/23/11 2358 

## 2011-09-17 ENCOUNTER — Emergency Department (HOSPITAL_COMMUNITY)
Admission: EM | Admit: 2011-09-17 | Discharge: 2011-09-17 | Payer: Self-pay | Attending: Emergency Medicine | Admitting: Emergency Medicine

## 2011-09-17 ENCOUNTER — Encounter (HOSPITAL_COMMUNITY): Payer: Self-pay | Admitting: Emergency Medicine

## 2011-09-17 DIAGNOSIS — H5710 Ocular pain, unspecified eye: Secondary | ICD-10-CM | POA: Insufficient documentation

## 2011-09-17 DIAGNOSIS — H5789 Other specified disorders of eye and adnexa: Secondary | ICD-10-CM | POA: Insufficient documentation

## 2011-09-17 DIAGNOSIS — F341 Dysthymic disorder: Secondary | ICD-10-CM | POA: Insufficient documentation

## 2011-09-17 DIAGNOSIS — F17200 Nicotine dependence, unspecified, uncomplicated: Secondary | ICD-10-CM | POA: Insufficient documentation

## 2011-09-17 DIAGNOSIS — T1590XA Foreign body on external eye, part unspecified, unspecified eye, initial encounter: Secondary | ICD-10-CM | POA: Insufficient documentation

## 2011-09-17 DIAGNOSIS — S058X9A Other injuries of unspecified eye and orbit, initial encounter: Secondary | ICD-10-CM | POA: Insufficient documentation

## 2011-09-17 DIAGNOSIS — S0551XA Penetrating wound with foreign body of right eyeball, initial encounter: Secondary | ICD-10-CM

## 2011-09-17 DIAGNOSIS — H53149 Visual discomfort, unspecified: Secondary | ICD-10-CM | POA: Insufficient documentation

## 2011-09-17 DIAGNOSIS — H209 Unspecified iridocyclitis: Secondary | ICD-10-CM | POA: Insufficient documentation

## 2011-09-17 DIAGNOSIS — Z79899 Other long term (current) drug therapy: Secondary | ICD-10-CM | POA: Insufficient documentation

## 2011-09-17 DIAGNOSIS — K257 Chronic gastric ulcer without hemorrhage or perforation: Secondary | ICD-10-CM | POA: Insufficient documentation

## 2011-09-17 MED ORDER — HYDROMORPHONE HCL PF 2 MG/ML IJ SOLN
2.0000 mg | Freq: Once | INTRAMUSCULAR | Status: AC
  Administered 2011-09-17: 2 mg via INTRAMUSCULAR

## 2011-09-17 MED ORDER — FLUORESCEIN SODIUM 1 MG OP STRP
ORAL_STRIP | OPHTHALMIC | Status: AC
  Filled 2011-09-17: qty 1

## 2011-09-17 MED ORDER — HYDROMORPHONE HCL PF 2 MG/ML IJ SOLN
INTRAMUSCULAR | Status: AC
  Administered 2011-09-17: 2 mg via INTRAMUSCULAR
  Filled 2011-09-17: qty 1

## 2011-09-17 MED ORDER — TETRACAINE HCL 0.5 % OP SOLN
OPHTHALMIC | Status: AC
  Filled 2011-09-17: qty 2

## 2011-09-17 MED ORDER — OXYCODONE HCL 5 MG PO TABS
5.0000 mg | ORAL_TABLET | Freq: Once | ORAL | Status: AC
  Administered 2011-09-17: 5 mg via ORAL
  Filled 2011-09-17: qty 1

## 2011-09-17 MED ORDER — TETANUS-DIPHTH-ACELL PERTUSSIS 5-2.5-18.5 LF-MCG/0.5 IM SUSP
0.5000 mL | Freq: Once | INTRAMUSCULAR | Status: AC
  Administered 2011-09-17: 0.5 mL via INTRAMUSCULAR
  Filled 2011-09-17: qty 0.5

## 2011-09-17 MED ORDER — TETANUS-DIPHTHERIA TOXOIDS TD 5-2 LFU IM INJ
0.5000 mL | INJECTION | Freq: Once | INTRAMUSCULAR | Status: DC
  Filled 2011-09-17: qty 0.5

## 2011-09-17 NOTE — ED Notes (Signed)
To ED via private vehicle with c/o metal shavings in eyes--grinding metal, had safety glasses on, but still got in. th

## 2011-09-17 NOTE — ED Notes (Signed)
D/c'd with mother to Dr. Huel Coventry office.

## 2011-09-17 NOTE — ED Notes (Signed)
Happened Saturday- tried to get shavings out with Q-Tip last night, pain worse today.

## 2011-09-17 NOTE — ED Provider Notes (Addendum)
History     [name]: [id number]  Arrival date & time 09/17/11  1058   First MD Initiated Contact with Patient 09/17/11 1100      Chief Complaint  Patient presents with  . metal shavings in eyes     (Consider location/radiation/quality/duration/timing/severity/associated sxs/prior treatment) HPI Comments: Grinding metal and Saturday in a piece got in his eye. He has had persistent eye pain is only worsening. Pain is now 9/10 and sharp in nature.  Patient is a 31 y.o. male presenting with foreign body in eye. The history is provided by the patient.  Foreign Body in Eye This is a new problem. The current episode started 2 days ago. The problem occurs constantly. The problem has been gradually worsening. Associated symptoms comments: No matting, but tearing of the right eye and redness.  Pain in the right eye with light in the left eye.. Exacerbated by: Sherlynn Stalls, blinking. The symptoms are relieved by nothing. Treatments tried: Tried to take a Q-tip in and out but was unsuccessful. The treatment provided no relief.    Past Medical History  Diagnosis Date  . Back pain   . Gastric ulcer, chronic   . Depression   . Anxiety     History reviewed. No pertinent past surgical history.  History reviewed. No pertinent family history.  History  Substance Use Topics  . Smoking status: Current Some Day Smoker -- 1.0 packs/day for 15 years    Types: Cigarettes  . Smokeless tobacco: Not on file  . Alcohol Use: No      Review of Systems  Eyes: Positive for photophobia, pain and redness. Negative for discharge and itching.  All other systems reviewed and are negative.    Allergies  Benadryl; Nyquil; and Tylenol  Home Medications   Current Outpatient Rx  Name Route Sig Dispense Refill  . BUSPIRONE HCL 10 MG PO TABS Oral Take 10 mg by mouth 2 (two) times daily.      Marland Kitchen DIAZEPAM 10 MG PO TABS Oral Take 10 mg by mouth 3 (three) times daily as needed. anxiety     . MENTHOL (TOPICAL ANALGESIC)  10 % EX LIQD Apply externally Apply topically 2 (two) times daily.      Marland Kitchen ONE-DAILY MULTI VITAMINS PO TABS Oral Take 1 tablet by mouth daily.      Marland Kitchen NAPROXEN SODIUM 220 MG PO TABS Oral Take 880 mg by mouth 2 (two) times daily with a meal.     . OXYCODONE HCL ER 10 MG PO TB12 Oral Take 10 mg by mouth every 12 (twelve) hours.      Marland Kitchen VITAMIN C 500 MG PO TABS Oral Take 500 mg by mouth daily.        BP 144/101  Pulse 78  Temp(Src) 97.2 F (36.2 C) (Oral)  Resp 20  SpO2 98%  Physical Exam  Nursing note and vitals reviewed. Constitutional: He is oriented to person, place, and time. He appears well-developed and well-nourished. He appears distressed.  HENT:  Head: Normocephalic and atraumatic.  Mouth/Throat: Oropharynx is clear and moist.  Eyes: EOM are normal. Pupils are equal, round, and reactive to light. Right eye exhibits chemosis. Right eye exhibits no discharge and no exudate. No foreign body present in the right eye. Right conjunctiva is injected. Left conjunctiva is injected.  Slit lamp exam:      The right eye shows corneal abrasion, corneal flare, foreign body and fluorescein uptake. The right eye shows no corneal ulcer, no hyphema and no  hypopyon.         Consensual photophobia  Neurological: He is alert and oriented to person, place, and time.  Skin: Skin is warm and dry. No rash noted. No erythema.  Psychiatric: He has a normal mood and affect. His behavior is normal.    ED Course  FOREIGN BODY REMOVAL Date/Time: 09/17/2011 11:34 AM Performed by: Gwyneth Sprout Authorized by: Gwyneth Sprout Consent given by: patient Body area: eye Location details: right cornea Anesthesia: see MAR for details Local anesthetic: tetracaine drops Anesthetic total: 3 drops Patient sedated: no Patient restrained: no Patient cooperative: yes Localization method: visualized and slit lamp Removal mechanism: 25-gauge needle Eye examined with fluorescein. Fluorescein uptake. Corneal  abrasion size: small Corneal abrasion location: inferior and medial No residual rust ring present. Depth: embedded Post-procedure assessment: foreign body not removed Patient tolerance: Patient tolerated the procedure well with no immediate complications. Comments: Unable to remove metal and pt sent to ophtho   (including critical care time)  Labs Reviewed - No data to display No results found.   1. Foreign body, intraocular, right eye   2. Iritis       MDM   Patient with metal in the right eye. Also symptoms of iritis with consensual photophobia and conjunctival injection. Patient states he's unable to take the visual test because his eye hurts too bad but the light. On exam there is a piece of metal present in the cornea. Despite multiple attempts to remove with an 18-gauge needle unable to remove the metal. Speak with ophthalmology for referral. Tetanus shot updated. Patient does not wear contacts. No sign of rust ring.  Patient is to see ophthalmology at 1:15 today and they will further manage his eye pain and foreign body.        Gwyneth Sprout, MD 09/17/11 1155  Gwyneth Sprout, MD 09/17/11 2137

## 2011-09-17 NOTE — Discharge Instructions (Signed)
Eye Foreign Body It is common during physical activity for a foreign body to enter and stay in the eye. This may result in scratching (abrasion) of the eye, vision loss, eye pain, or the sensation of an object in the eye, causing teardrops to develop. Wearing eye protection significantly decreases the chance of a foreign body entering the eye. However, athletes often neglect to wear eye protection. SYMPTOMS   Irritation when blinking.   Decreased vision.   Eye pain.   Swelling of eye lids.  CAUSES   Contact between finger and the eye.   Contact between athletic equipment and the eye.   Dirt and grass in the eye.   Windblown debris in the eye.   Insect in the eye.  RISK INCREASES WITH:  Elvin So and dusty conditions.   Lack of protective eye gear.   Lack of protective headgear, including face masks.  PREVENTION   Avoid use of guns or toys that release projectiles or air.   Wear protective eyewear, such as polycarbonate lenses.   Wear protective headgear.  TREATMENT  Before seeking medical attention, you may flush the eyes out with lukewarm water. This may flush the foreign body out of the eye. If flushing does not work, you need to see a caregiver for a thorough eye exam. The caregiver will examine your eye for a foreign body, and for the possibility of a penetrating body within the globe of the eye. If your caregiver is not able to remove the foreign body in his or her office, surgery may be necessary. Finally, antibiotics and anti-inflammatory drops may be prescribed. Depending on the injury, a rigid shield or eye patch may be applied.  Document Released: 06/04/2005 Document Revised: 05/24/2011 Document Reviewed: 09/16/2008 [facility] Patient Information 2012 [place], [place].Iritis Iritis is an inflammation of the colored part of the eye (iris). Other parts at the front of the eye may also be inflamed. The iris is part of the middle layer of the eyeball which is called the uvea or  the uveal track. Any part of the uveal track can become inflamed. The other portions of the uveal track are the choroid (the thin membrane under the outer layer of the eye), and the ciliary body (joins the choroid and the iris and produces the fluid in the front of the eye).  It is extremely important to treat iritis early, as it may lead to internal eye damage causing scarring or diseases such as glaucoma. Some people have only one attack of iritis (in one or both eyes) in their lifetime, while others may get it many times. CAUSES Iritis can be associated with many different diseases, but mostly occurs in otherwise healthy people. Examples of diseases that can be associated with iritis include:  Diseases where the body's immune system attacks tissues within your own body (autoimmune diseases).   Infections (tuberculosis, gonorrhea, fungus infections, Lyme disease, infection of the lining of the heart).   Trauma or injury.   Eye diseases (acute glaucoma and others).   Inflammation from other parts of the uveal track.   Severe eye infections.   Other rare diseases.  SYMPTOMS  Eye pain or aching.   Sensitivity to light.   Loss of sight or blurred vision.   Redness of the eye. This is often accompanied by a ring of redness around the outside of the cornea, or clear covering at the front of the eye (ciliary flush).   Excessive tearing of the eye(s).   A small pupil  that does not enlarge in the dark and stays smaller than the other eye's pupil.   A whitish area that obscures the lower part of the colored circular iris. Sometimes this is visible when looking at the eye, where the whitish area has a "fluid level" or flat top. This is called a "hypopyon" and is actually pus inside the eye.  Since iritis causes the eye to become red, it is often confused with a much less dangerous form of "pink eye" or conjunctivitis. One of the most important symptoms is sensitivity to light. Anytime there  is redness, discomfort in the eye(s) and extreme light sensitivity, it is extremely important to see an ophthalmologist as soon as possible. TREATMENT Acute iritis requires prompt medical evaluation by an eye specialist (ophthalmologist.) Treatment depends on the underlying cause but may include:  Corticosteroid eye drops and dilating eye drops. Follow your caregiver's exact instructions on taking and stopping corticosteroid medications (drops or pills).   Occasionally, the iritis will be so severe that it will not respond to commonly used medications. If this happens, it may be necessary to use steroid injections. The injections are given under the eye's outer surface. Sometimes oral medications are given. The decision on treatment used for iritis is usually made on an individual basis.  HOME CARE INSTRUCTIONS Your care giver will give specific instructions regarding the use of eye medications or other medications. Be certain to follow all instructions in both taking and stopping the medications. SEEK IMMEDIATE MEDICAL CARE IF:  You have redness of one or both eye.   You experience a great deal of light sensitivity.   You have pain or aching in either eye.  MAKE SURE YOU:   Understand these instructions.   Will watch your condition.   Will get help right away if you are not doing well or get worse.  Document Released: 06/04/2005 Document Revised: 05/24/2011 Document Reviewed: 11/22/2006 [facility] Patient Information 2012 [place], [place].

## 2011-09-18 ENCOUNTER — Emergency Department (HOSPITAL_BASED_OUTPATIENT_CLINIC_OR_DEPARTMENT_OTHER)
Admission: EM | Admit: 2011-09-18 | Discharge: 2011-09-18 | Disposition: A | Discharge status: Home / Self Care | Payer: Self-pay | Attending: Emergency Medicine | Admitting: Emergency Medicine

## 2011-09-18 ENCOUNTER — Encounter (HOSPITAL_BASED_OUTPATIENT_CLINIC_OR_DEPARTMENT_OTHER): Payer: Self-pay | Admitting: Student

## 2011-09-18 DIAGNOSIS — H5710 Ocular pain, unspecified eye: Secondary | ICD-10-CM | POA: Insufficient documentation

## 2011-09-18 DIAGNOSIS — S0500XA Injury of conjunctiva and corneal abrasion without foreign body, unspecified eye, initial encounter: Secondary | ICD-10-CM

## 2011-09-18 DIAGNOSIS — T1590XA Foreign body on external eye, part unspecified, unspecified eye, initial encounter: Secondary | ICD-10-CM | POA: Insufficient documentation

## 2011-09-18 DIAGNOSIS — S058X9A Other injuries of unspecified eye and orbit, initial encounter: Secondary | ICD-10-CM | POA: Insufficient documentation

## 2011-09-18 DIAGNOSIS — Y9269 Other specified industrial and construction area as the place of occurrence of the external cause: Secondary | ICD-10-CM | POA: Insufficient documentation

## 2011-09-18 DIAGNOSIS — G8929 Other chronic pain: Secondary | ICD-10-CM | POA: Insufficient documentation

## 2011-09-18 DIAGNOSIS — F341 Dysthymic disorder: Secondary | ICD-10-CM | POA: Insufficient documentation

## 2011-09-18 HISTORY — DX: Other chronic pain: G89.29

## 2011-09-18 MED ORDER — FLUORESCEIN SODIUM 1 MG OP STRP
1.0000 | ORAL_STRIP | Freq: Once | OPHTHALMIC | Status: AC
  Administered 2011-09-18: 1 via OPHTHALMIC
  Filled 2011-09-18: qty 1

## 2011-09-18 MED ORDER — OXYCODONE-ACETAMINOPHEN 5-325 MG PO TABS
2.0000 | ORAL_TABLET | Freq: Once | ORAL | Status: DC
  Filled 2011-09-18: qty 2

## 2011-09-18 MED ORDER — OXYCODONE HCL 5 MG PO TABS
10.0000 mg | ORAL_TABLET | Freq: Once | ORAL | Status: DC
  Filled 2011-09-18: qty 2

## 2011-09-18 MED ORDER — OXYCODONE HCL 5 MG PO TABS: 10.0000 mg | ORAL_TABLET | ORAL | Status: AC | PRN

## 2011-09-18 MED ORDER — TETRACAINE HCL 0.5 % OP SOLN
2.0000 [drp] | Freq: Once | OPHTHALMIC | Status: AC
  Administered 2011-09-18: 2 [drp] via OPHTHALMIC
  Filled 2011-09-18: qty 2

## 2011-09-18 MED ORDER — ONDANSETRON 4 MG PO TBDP: 4.0000 mg | ORAL_TABLET | Freq: Once | ORAL | Status: DC

## 2011-09-18 NOTE — ED Provider Notes (Signed)
History     [name]: [id number]  Arrival date & time 09/18/11  1544   First MD Initiated Contact with Patient 09/18/11 1556      Chief Complaint  Patient presents with  . Eye Pain    injury to right eye    (Consider location/radiation/quality/duration/timing/severity/associated sxs/prior treatment) HPI Comments: Patient with right eye pain for the past 3 days. He was grinding metal on Saturday got several metal fragments into his eye. His family member tried to remove it with a Q-tip. She presented to the ED yesterday and had some foreign bodies extracted. One persisted he saw Dr. Burgess Estelle today. According to the patient Dr. Burgess Estelle had to cut his cornea because the piece was so big.  Is complaining of severe eye pain. No nausea, vomiting, headache. Patient does have chronic pain issues with his back and takes oxycodone. He is unable to cooperate for visual acuity. He denies any new injury  The history is provided by the patient.    Past Medical History  Diagnosis Date  . Back pain   . Gastric ulcer, chronic   . Depression   . Anxiety   . Chronic pain     History reviewed. No pertinent past surgical history.  History reviewed. No pertinent family history.  History  Substance Use Topics  . Smoking status: Current Some Day Smoker -- 1.0 packs/day for 15 years    Types: Cigarettes  . Smokeless tobacco: Not on file  . Alcohol Use: No      Review of Systems  Constitutional: Negative for activity change and appetite change.  HENT: Negative for congestion and rhinorrhea.   Eyes: Positive for photophobia, pain, redness and visual disturbance.  Respiratory: Negative for chest tightness.   Cardiovascular: Negative for chest pain.  Gastrointestinal: Negative for nausea, vomiting and abdominal pain.  Genitourinary: Negative for dysuria and hematuria.  Musculoskeletal: Negative for back pain.  Neurological: Negative for headaches.    Allergies  Benadryl; Nyquil; and  Tylenol  Home Medications   Current Outpatient Rx  Name Route Sig Dispense Refill  . BUSPIRONE HCL 10 MG PO TABS Oral Take 10 mg by mouth 2 (two) times daily.      Marland Kitchen DIAZEPAM 10 MG PO TABS Oral Take 10 mg by mouth 3 (three) times daily as needed. anxiety     . GABAPENTIN 300 MG PO CAPS Oral Take 300 mg by mouth 3 (three) times daily.    Marland Kitchen MENTHOL (TOPICAL ANALGESIC) 10 % EX LIQD Apply externally Apply topically 2 (two) times daily.      Marland Kitchen ONE-DAILY MULTI VITAMINS PO TABS Oral Take 1 tablet by mouth daily.      . OXYCODONE HCL 5 MG PO TABS Oral Take 5-10 mg by mouth daily as needed. pain    . PAROXETINE HCL 10 MG PO TABS Oral Take 10 mg by mouth every morning.    Marland Kitchen VITAMIN C 500 MG PO TABS Oral Take 500 mg by mouth daily.      Marland Kitchen ZOLPIDEM TARTRATE 5 MG PO TABS Oral Take 5 mg by mouth at bedtime as needed.      BP 140/104  Pulse 85  Temp 99 F (37.2 C)  Resp 22  Wt 175 lb (79.379 kg)  SpO2 99%  Physical Exam  Constitutional: He is oriented to person, place, and time. He appears well-developed and well-nourished. No distress.  Eyes: Pupils are equal, round, and reactive to light. Right eye exhibits chemosis. Right conjunctiva is injected. Right  eye exhibits no nystagmus.  Slit lamp exam:      The right eye shows corneal abrasion and fluorescein uptake. The right eye shows no hyphema, no hypopyon and no anterior chamber bulge.    Neck: Normal range of motion. Neck supple.  Cardiovascular: Normal rate, regular rhythm and normal heart sounds.   Pulmonary/Chest: Effort normal and breath sounds normal. No respiratory distress.  Abdominal: Soft. There is no tenderness. There is no rebound and no guarding.  Musculoskeletal: Normal range of motion. He exhibits no edema and no tenderness.  Neurological: He is alert and oriented to person, place, and time. No cranial nerve deficit.  Skin: Skin is warm.    ED Course  Procedures (including critical care time)  Labs Reviewed - No data to  display No results found.   No diagnosis found.    MDM  Right eye pain after foreign body removal.  Using ofloxacin drops without relief. No headache, nausea, vomiting  IOP OD 14, OS 13  There no new foreign body seen in patient's eye exam today. No rust ring. He does have small punctate abrasions at the 7 and 8:00 positions. I discussed his case with Dr. Burgess Estelle who confirmed he saw the patient yesterday. Reinforced to the patient that pain and discomfort is expected given the type of injury he had. He is scheduled for followup with Dr. Burgess Estelle again on Thursday. Continue ofloxacin drops. Keep eye closes much as possible and well lubricated.     Glynn Octave, MD 09/18/11 [phone]

## 2011-09-18 NOTE — Discharge Instructions (Signed)
Corneal Abrasion The cornea is the clear covering at the front and center of the eye. It is a thin tissue made up of layers. The top layer is the most sensitive layer. A corneal abrasion happens if this layer is scratched or an injury causes it to come off.  HOME CARE  You may be given drops or a medicated cream. Use the medicine as told by your doctor.   A pressure patch may be put over the eye. If this is done, follow your doctor's instructions for when to remove the patch. Do not drive or use machines while the eye patch is on. Judging distances is hard to do with a patch on.   See your doctor for a follow-up exam if you are told to do so.  GET HELP RIGHT AWAY IF:   The pain is getting worse or is very bad.   The eye is very sensitive to light.   Any liquid comes out of the injured eye after treatment.   Your vision suddenly gets worse.   You have a sudden loss of vision or blindness.  MAKE SURE YOU:   Understand these instructions.   Will watch your condition.   Will get help right away if you are not doing well or get worse.  Document Released: 11/21/2007 Document Revised: 05/24/2011 Document Reviewed: 11/21/2007 [name] Patient Information 2012 [place], [place].

## 2011-09-18 NOTE — ED Notes (Signed)
Pt in with c/o pain to right eye r/t injury to right eye - reports steel shavings removed from right eye.

## 2011-09-18 NOTE — ED Notes (Signed)
MD at bedside. 

## 2011-10-04 ENCOUNTER — Encounter (HOSPITAL_BASED_OUTPATIENT_CLINIC_OR_DEPARTMENT_OTHER): Payer: Self-pay | Admitting: Emergency Medicine

## 2011-10-04 ENCOUNTER — Emergency Department (HOSPITAL_BASED_OUTPATIENT_CLINIC_OR_DEPARTMENT_OTHER)
Admission: EM | Admit: 2011-10-04 | Discharge: 2011-10-04 | Disposition: A | Discharge status: Home / Self Care | Payer: Self-pay | Attending: Emergency Medicine | Admitting: Emergency Medicine

## 2011-10-04 DIAGNOSIS — M549 Dorsalgia, unspecified: Secondary | ICD-10-CM | POA: Insufficient documentation

## 2011-10-04 DIAGNOSIS — Z9889 Other specified postprocedural states: Secondary | ICD-10-CM | POA: Insufficient documentation

## 2011-10-04 MED ORDER — PROMETHAZINE HCL 25 MG/ML IJ SOLN
25.0000 mg | Freq: Once | INTRAMUSCULAR | Status: AC
  Administered 2011-10-04: 25 mg via INTRAMUSCULAR
  Filled 2011-10-04: qty 1

## 2011-10-04 MED ORDER — HYDROMORPHONE HCL PF 2 MG/ML IJ SOLN
INTRAMUSCULAR | Status: AC
  Filled 2011-10-04: qty 1

## 2011-10-04 MED ORDER — DIPHENHYDRAMINE HCL 50 MG/ML IJ SOLN
25.0000 mg | Freq: Once | INTRAMUSCULAR | Status: AC
  Administered 2011-10-04: 17:00:00 via INTRAMUSCULAR
  Filled 2011-10-04: qty 1

## 2011-10-04 MED ORDER — HYDROMORPHONE HCL PF 2 MG/ML IJ SOLN
2.0000 mg | Freq: Once | INTRAMUSCULAR | Status: AC
  Administered 2011-10-04: 2 mg via INTRAMUSCULAR
  Filled 2011-10-04: qty 1

## 2011-10-04 MED ORDER — KETOROLAC TROMETHAMINE 60 MG/2ML IM SOLN
60.0000 mg | Freq: Once | INTRAMUSCULAR | Status: AC
  Administered 2011-10-04: 60 mg via INTRAMUSCULAR
  Filled 2011-10-04: qty 2

## 2011-10-04 NOTE — ED Provider Notes (Signed)
History     [name]: [id number]  Arrival date & time 10/04/11  1642   First MD Initiated Contact with Patient 10/04/11 1701      Chief Complaint  Patient presents with  . Back Pain    (Consider location/radiation/quality/duration/timing/severity/associated sxs/prior treatment) Patient is a 31 y.o. male presenting with back pain. The history is provided by the patient.  Back Pain  This is a chronic problem. Episode onset: six months ago, much worse today after what sounds like esi at [facility]. The problem occurs constantly. The problem has been rapidly worsening. The pain is associated with no known injury. The pain is present in the lumbar spine. The quality of the pain is described as shooting. The pain does not radiate. The pain is at a severity of 10/10. The pain is severe. The symptoms are aggravated by bending, twisting and certain positions. The pain is the same all the time.    Past Medical History  Diagnosis Date  . Back pain   . Gastric ulcer, chronic   . Depression   . Anxiety   . Chronic pain     History reviewed. No pertinent past surgical history.  No family history on file.  History  Substance Use Topics  . Smoking status: Current Everyday Smoker -- 1.0 packs/day for 15 years    Types: Cigarettes  . Smokeless tobacco: Not on file  . Alcohol Use: No      Review of Systems  Musculoskeletal: Positive for back pain.  All other systems reviewed and are negative.    Allergies  Benadryl; Nyquil; and Tylenol  Home Medications   Current Outpatient Rx  Name Route Sig Dispense Refill  . BUSPIRONE HCL 10 MG PO TABS Oral Take 10 mg by mouth 2 (two) times daily.      Marland Kitchen DIAZEPAM 10 MG PO TABS Oral Take 10 mg by mouth 3 (three) times daily as needed. anxiety     . GABAPENTIN 300 MG PO CAPS Oral Take 300 mg by mouth 3 (three) times daily.    Marland Kitchen MENTHOL (TOPICAL ANALGESIC) 10 % EX LIQD Apply externally Apply topically 2 (two) times daily.      Marland Kitchen ONE-DAILY MULTI  VITAMINS PO TABS Oral Take 1 tablet by mouth daily.      . OXYCODONE HCL 5 MG PO TABS Oral Take 5-10 mg by mouth daily as needed. pain    . PAROXETINE HCL 10 MG PO TABS Oral Take 10 mg by mouth every morning.    Marland Kitchen VITAMIN C 500 MG PO TABS Oral Take 500 mg by mouth daily.      Marland Kitchen ZOLPIDEM TARTRATE 5 MG PO TABS Oral Take 5 mg by mouth at bedtime as needed.      BP 121/91  Pulse 94  Temp(Src) 97.4 F (36.3 C) (Oral)  Resp 20  Ht 6' (1.829 m)  Wt 170 lb (77.111 kg)  BMI 23.06 kg/m2  SpO2 99%  Physical Exam  Nursing note and vitals reviewed. Constitutional: He is oriented to person, place, and time. He appears well-developed and well-nourished. No distress.       Appears very uncomfortable.  HENT:  Head: Normocephalic and atraumatic.  Neck: Normal range of motion. Neck supple.  Musculoskeletal:       There is a site of a puncture in the lumbar spine.  Appears well with no surrounding hematoma or swelling.  Neurological: He is alert and oriented to person, place, and time.  DTR's are 2+ and equal in ble.  Strength is 5/5 in ble.    Skin: Skin is warm and dry. He is not diaphoretic.    ED Course  Procedures (including critical care time)  Labs Reviewed - No data to display No results found.   No diagnosis found.    MDM  Feels better with meds.  Will discharge to home.  Needs to follow up with surgeon, return prn.        Geoffery Lyons, MD 10/04/11 [phone]

## 2011-10-04 NOTE — ED Notes (Signed)
Pt had epidural today for chronic pain.  Had relief post procedure.  On the way home pain started having pain in low back, buttocks, and rectum.  Pain has intensified since then.

## 2011-10-04 NOTE — Discharge Instructions (Signed)

## 2011-10-05 ENCOUNTER — Telehealth (HOSPITAL_BASED_OUTPATIENT_CLINIC_OR_DEPARTMENT_OTHER): Payer: Self-pay | Admitting: *Deleted

## 2011-10-05 NOTE — ED Notes (Signed)
Patient called requesting note stating he was seen in the emergency department on 10/04/11 to be faxed to  [phone].

## 2011-10-11 ENCOUNTER — Emergency Department (HOSPITAL_BASED_OUTPATIENT_CLINIC_OR_DEPARTMENT_OTHER)
Admission: EM | Admit: 2011-10-11 | Discharge: 2011-10-11 | Disposition: A | Discharge status: Home / Self Care | Payer: Self-pay | Attending: Emergency Medicine | Admitting: Emergency Medicine

## 2011-10-11 ENCOUNTER — Encounter (HOSPITAL_BASED_OUTPATIENT_CLINIC_OR_DEPARTMENT_OTHER): Payer: Self-pay | Admitting: Emergency Medicine

## 2011-10-11 ENCOUNTER — Emergency Department (HOSPITAL_BASED_OUTPATIENT_CLINIC_OR_DEPARTMENT_OTHER)
Admission: EM | Admit: 2011-10-11 | Discharge: 2011-10-12 | Disposition: A | Discharge status: Home / Self Care | Payer: Self-pay | Attending: Emergency Medicine | Admitting: Emergency Medicine

## 2011-10-11 ENCOUNTER — Emergency Department (INDEPENDENT_AMBULATORY_CARE_PROVIDER_SITE_OTHER): Payer: Self-pay

## 2011-10-11 ENCOUNTER — Encounter (HOSPITAL_BASED_OUTPATIENT_CLINIC_OR_DEPARTMENT_OTHER): Payer: Self-pay | Admitting: *Deleted

## 2011-10-11 DIAGNOSIS — M545 Low back pain, unspecified: Secondary | ICD-10-CM | POA: Insufficient documentation

## 2011-10-11 DIAGNOSIS — Y9301 Activity, walking, marching and hiking: Secondary | ICD-10-CM | POA: Insufficient documentation

## 2011-10-11 DIAGNOSIS — IMO0002 Reserved for concepts with insufficient information to code with codable children: Secondary | ICD-10-CM | POA: Insufficient documentation

## 2011-10-11 DIAGNOSIS — M549 Dorsalgia, unspecified: Secondary | ICD-10-CM

## 2011-10-11 DIAGNOSIS — R209 Unspecified disturbances of skin sensation: Secondary | ICD-10-CM | POA: Insufficient documentation

## 2011-10-11 DIAGNOSIS — W19XXXA Unspecified fall, initial encounter: Secondary | ICD-10-CM | POA: Insufficient documentation

## 2011-10-11 DIAGNOSIS — F17200 Nicotine dependence, unspecified, uncomplicated: Secondary | ICD-10-CM | POA: Insufficient documentation

## 2011-10-11 DIAGNOSIS — T148XXA Other injury of unspecified body region, initial encounter: Secondary | ICD-10-CM

## 2011-10-11 DIAGNOSIS — M25559 Pain in unspecified hip: Secondary | ICD-10-CM | POA: Insufficient documentation

## 2011-10-11 DIAGNOSIS — F411 Generalized anxiety disorder: Secondary | ICD-10-CM | POA: Insufficient documentation

## 2011-10-11 DIAGNOSIS — Y998 Other external cause status: Secondary | ICD-10-CM | POA: Insufficient documentation

## 2011-10-11 DIAGNOSIS — G8929 Other chronic pain: Secondary | ICD-10-CM | POA: Insufficient documentation

## 2011-10-11 DIAGNOSIS — M129 Arthropathy, unspecified: Secondary | ICD-10-CM | POA: Insufficient documentation

## 2011-10-11 DIAGNOSIS — M79609 Pain in unspecified limb: Secondary | ICD-10-CM | POA: Insufficient documentation

## 2011-10-11 HISTORY — DX: Unspecified osteoarthritis, unspecified site: M19.90

## 2011-10-11 MED ORDER — HYDROMORPHONE HCL PF 2 MG/ML IJ SOLN
2.0000 mg | Freq: Once | INTRAMUSCULAR | Status: AC
  Administered 2011-10-11: 2 mg via INTRAMUSCULAR
  Filled 2011-10-11: qty 1

## 2011-10-11 MED ORDER — OXYCODONE HCL 5 MG PO TABS: 5.0000 mg | ORAL_TABLET | ORAL | Status: AC | PRN

## 2011-10-11 MED ORDER — IBUPROFEN 800 MG PO TABS
800.0000 mg | ORAL_TABLET | Freq: Once | ORAL | Status: AC
  Administered 2011-10-11: 800 mg via ORAL
  Filled 2011-10-11: qty 1

## 2011-10-11 NOTE — ED Notes (Signed)
Pt states that he was able to get his pain medication last night, but his friend that was with him last night took 6 of the 10 that were given to him and he only was able to get four of them, cont. To complain of left hip/sacral pain, abrasions to left knee and shin cleansed, bleeding controlled, pt states that he has no were to live, asked about list of homeless shelters that were discussed with him last pm, he stated that he had no way to get there, explained that we may be able to assist him to get to homeless shelter, stated he could not do that because he has to get to [place] because that is only place he knows people.

## 2011-10-11 NOTE — ED Notes (Signed)
Food provided to patient, pt thankful, originally offered pt food during first visit and he refused stating he was not hungry

## 2011-10-11 NOTE — ED Notes (Signed)
States he was coming here for c.o back pain. He fell coming into the hospital and injuried his right knee. Was kicked out of his parents house and is homeless. States he has not slept in 4 days.

## 2011-10-11 NOTE — ED Notes (Signed)
Pt states that he overheard staff talking about him and laughing. Assured pt that he was not the topic of the conversation and was more than welcome to stay to be seen. Pt stated that he was getting out of here and ambulated out of the dept with his belongings using his walking stick. Pt seen ambulating towards snack machines. Security informed of pt's malingering behavior and need to be seen or leave the premises. Security spoke at length with pt regarding his options and pt now requesting to be seen.

## 2011-10-11 NOTE — ED Notes (Signed)
Pt fell walking from fishing pond tonight, states he fell on left hip c/o numbness on that side
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
## 2011-10-12 NOTE — Discharge Instructions (Signed)
 RESOURCE GUIDE  Dental Problems  Patients with Medicaid: [facility]                     [facility] [address]                                           [address] Phone:  [phone]                                                  Phone:  [phone]  If unable to pay or uninsured, contact:  [facility] or [facility] to become qualified for the adult dental clinic.  Chronic Pain Problems Contact [facility]  [phone] Patients need to be referred by their primary care doctor.  Insufficient Money for Medicine Contact [employer]:  call "211" or [facility] 271-5999.  No Primary Care Doctor Call [facility]  832-8000 Other agencies that provide inexpensive medical care    [facility]  [phone]    [facility]  [phone]    [facility]  [phone]    [facility] Clinic  [phone]    [facility]  [phone]    [facility]  [phone]  Psychological Services [facility]  [phone] [facility]  [phone] [facility]   [phone] (emergency services 641-4993)  Substance Abuse Resources [facility]  336-882-2125 [facility] 336-784-9470 [facility] 336-285-9073 [facility] 336-845-3988 Residential & Outpatient Substance Abuse Program  800-659-3381  Abuse/Neglect [place] Child Abuse Hotline (336) 641-3795 [place] Child Abuse Hotline 800-378-5315 (After Hours)  Emergency Shelter [facility] (336) 271-5985  [facility] (336) 275-9566 [facility] (704) 372-4663  MRSA Hotline #:   832-7006    [place] Resources  [facility] [place]     [employer]                          [facility] [address] [place]                       [address]      [address]  [place]                                                [place]                            [place] Phone:  [phone]                                   Phone:  [phone]                 Phone:  [phone]  [facility] Phone:    [phone]  [place] Child Abuse Hotline (336) 342-1394 (336) 342-3537 (After Hours)   

## 2011-10-12 NOTE — ED Notes (Signed)
Pt rambling through drawers, bags, and personal effects while being discharged. Pt stated that it was against the law for [place] to d/c him out into the cold. I informed pt that he was given resource numbers to contact for housing assistance last night during his visit and if needed we could provide him with that information again. Pt declined and stated he would just go. Pt escorted to the front door of the department by this RN and Engineer, materials. Pt ambulated out of dept using a walking stick without assist. NAD noted, pt alert and oriented x4.

## 2011-10-12 NOTE — ED Notes (Signed)
Pt wounds again bloody, attempted to clean wound again, pt got very agitated and stated he could not allow me to cleanse the wounds or redress them.

## 2012-01-27 ENCOUNTER — Encounter (HOSPITAL_BASED_OUTPATIENT_CLINIC_OR_DEPARTMENT_OTHER): Payer: Self-pay

## 2012-01-27 ENCOUNTER — Emergency Department (HOSPITAL_BASED_OUTPATIENT_CLINIC_OR_DEPARTMENT_OTHER): Payer: Self-pay

## 2012-01-27 ENCOUNTER — Emergency Department (HOSPITAL_BASED_OUTPATIENT_CLINIC_OR_DEPARTMENT_OTHER)
Admission: EM | Admit: 2012-01-27 | Discharge: 2012-01-27 | Disposition: A | Discharge status: Home / Self Care | Payer: Self-pay | Attending: Emergency Medicine | Admitting: Emergency Medicine

## 2012-01-27 DIAGNOSIS — M129 Arthropathy, unspecified: Secondary | ICD-10-CM | POA: Insufficient documentation

## 2012-01-27 DIAGNOSIS — G8929 Other chronic pain: Secondary | ICD-10-CM | POA: Insufficient documentation

## 2012-01-27 DIAGNOSIS — F17200 Nicotine dependence, unspecified, uncomplicated: Secondary | ICD-10-CM | POA: Insufficient documentation

## 2012-01-27 DIAGNOSIS — J40 Bronchitis, not specified as acute or chronic: Secondary | ICD-10-CM | POA: Insufficient documentation

## 2012-01-27 MED ORDER — ALBUTEROL SULFATE HFA 108 (90 BASE) MCG/ACT IN AERS
2.0000 | INHALATION_SPRAY | RESPIRATORY_TRACT | Status: DC | PRN
  Administered 2012-01-27: 2 via RESPIRATORY_TRACT
  Filled 2012-01-27: qty 6.7

## 2012-01-27 MED ORDER — ALBUTEROL SULFATE (5 MG/ML) 0.5% IN NEBU
5.0000 mg | INHALATION_SOLUTION | Freq: Once | RESPIRATORY_TRACT | Status: AC
  Administered 2012-01-27: 5 mg via RESPIRATORY_TRACT
  Filled 2012-01-27: qty 1

## 2012-01-27 NOTE — ED Notes (Signed)
Patient transported to X-ray 

## 2012-01-27 NOTE — ED Notes (Signed)
MD at bedside. 

## 2012-01-27 NOTE — ED Provider Notes (Signed)
History     [name]: [id number]  Arrival date & time 01/27/12  4098   First MD Initiated Contact with Patient 01/27/12 [phone]      Chief Complaint  Patient presents with  . Cough  . Fever  . URI    (Consider location/radiation/quality/duration/timing/severity/associated sxs/prior treatment) HPI Pt with is a smoker reports 2 days of worsening cough, congestion, subjective fevers and SOB associated with general aches/myalgias. States cough is dry, but feels a 'rattle'. He has had some post-tussive emesis.   Past Medical History  Diagnosis Date  . Back pain   . Gastric ulcer, chronic   . Depression   . Anxiety   . Chronic pain   . Arthritis     History reviewed. No pertinent past surgical history.  No family history on file.  History  Substance Use Topics  . Smoking status: Current Everyday Smoker -- 1.0 packs/day for 15 years    Types: Cigarettes  . Smokeless tobacco: Not on file  . Alcohol Use: No      Review of Systems All other systems reviewed and are negative except as noted in HPI.   Allergies  Benadryl; Nyquil; Tramadol; and Tylenol  Home Medications   Current Outpatient Rx  Name Route Sig Dispense Refill  . RISPERIDONE 2 MG PO TABS Oral Take 2 mg by mouth at bedtime.    . BUSPIRONE HCL 10 MG PO TABS Oral Take 10 mg by mouth 2 (two) times daily.      Marland Kitchen DIAZEPAM 10 MG PO TABS Oral Take 10 mg by mouth daily. anxiety    . GABAPENTIN 300 MG PO CAPS Oral Take 300 mg by mouth 3 (three) times daily.    . OXYCODONE HCL 5 MG PO TABS Oral Take 5-10 mg by mouth daily as needed. pain    . PAROXETINE HCL 10 MG PO TABS Oral Take 10 mg by mouth every morning.    Marland Kitchen VITAMIN C 500 MG PO TABS Oral Take 500 mg by mouth daily.      Marland Kitchen ZOLPIDEM TARTRATE 5 MG PO TABS Oral Take 5 mg by mouth at bedtime as needed.      BP 137/89  Pulse 104  Temp 97.9 F (36.6 C) (Oral)  Resp 20  Ht 6\' 1"  (1.854 m)  Wt 184 lb (83.462 kg)  BMI 24.28 kg/m2  SpO2 98%  Physical Exam    Nursing note and vitals reviewed. Constitutional: He is oriented to person, place, and time. He appears well-developed and well-nourished.  HENT:  Head: Normocephalic and atraumatic.       Posterior pharynx erythematous without edema or exudate  Eyes: EOM are normal. Pupils are equal, round, and reactive to light.  Neck: Normal range of motion. Neck supple.  Cardiovascular: Normal rate, normal heart sounds and intact distal pulses.   Pulmonary/Chest: Effort normal. He has wheezes.  Abdominal: Bowel sounds are normal. He exhibits no distension. There is no tenderness.  Musculoskeletal: Normal range of motion. He exhibits no edema and no tenderness.  Neurological: He is alert and oriented to person, place, and time. He has normal strength. No cranial nerve deficit or sensory deficit.  Skin: Skin is warm and dry. No rash noted.  Psychiatric: He has a normal mood and affect.    ED Course  Procedures (including critical care time)  Labs Reviewed - No data to display Dg Chest 2 View  01/27/2012  *RADIOLOGY REPORT*  Clinical Data: Cough, fever, evaluate for pneumonia  CHEST -  2 VIEW  Comparison: 04/29/2010; 04/08/2010  Findings: Normal cardiac silhouette and mediastinal contours.  The lungs appear mildly hyperinflated.  There is mild thickening of the pulmonary interstitium.  No focal airspace opacities.  No pleural effusion or pneumothorax.  No acute osseous abnormalities.  IMPRESSION: Overall findings suggestive of airways disease/bronchitis.  No focal airspace opacities to suggest pneumonia.  Original Report Authenticated By: Waynard Reeds, M.D.     1. Bronchitis       MDM  Likely URI/Bronchitis. Will check CXR. Albuterol neb.     9:45 AM Wheezing improved. CXR neg for infiltrate. D/c with inhaler. PCP followup.     Charles B. Bernette Mayers, MD 01/27/12 [phone]

## 2012-01-27 NOTE — ED Notes (Signed)
RRT at bedside administering neb tx. 
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
## 2012-04-12 ENCOUNTER — Encounter (HOSPITAL_BASED_OUTPATIENT_CLINIC_OR_DEPARTMENT_OTHER): Payer: Self-pay | Admitting: Student

## 2012-04-12 ENCOUNTER — Emergency Department (HOSPITAL_BASED_OUTPATIENT_CLINIC_OR_DEPARTMENT_OTHER): Payer: Self-pay

## 2012-04-12 ENCOUNTER — Emergency Department (HOSPITAL_BASED_OUTPATIENT_CLINIC_OR_DEPARTMENT_OTHER)
Admission: EM | Admit: 2012-04-12 | Discharge: 2012-04-12 | Disposition: A | Discharge status: Home / Self Care | Payer: Self-pay | Attending: Emergency Medicine | Admitting: Emergency Medicine

## 2012-04-12 DIAGNOSIS — Z79899 Other long term (current) drug therapy: Secondary | ICD-10-CM | POA: Insufficient documentation

## 2012-04-12 DIAGNOSIS — R110 Nausea: Secondary | ICD-10-CM | POA: Insufficient documentation

## 2012-04-12 DIAGNOSIS — F411 Generalized anxiety disorder: Secondary | ICD-10-CM | POA: Insufficient documentation

## 2012-04-12 DIAGNOSIS — Z87891 Personal history of nicotine dependence: Secondary | ICD-10-CM | POA: Insufficient documentation

## 2012-04-12 DIAGNOSIS — F319 Bipolar disorder, unspecified: Secondary | ICD-10-CM | POA: Insufficient documentation

## 2012-04-12 DIAGNOSIS — Z8739 Personal history of other diseases of the musculoskeletal system and connective tissue: Secondary | ICD-10-CM | POA: Insufficient documentation

## 2012-04-12 DIAGNOSIS — Z8711 Personal history of peptic ulcer disease: Secondary | ICD-10-CM | POA: Insufficient documentation

## 2012-04-12 DIAGNOSIS — M549 Dorsalgia, unspecified: Secondary | ICD-10-CM | POA: Insufficient documentation

## 2012-04-12 DIAGNOSIS — F329 Major depressive disorder, single episode, unspecified: Secondary | ICD-10-CM | POA: Insufficient documentation

## 2012-04-12 DIAGNOSIS — F3289 Other specified depressive episodes: Secondary | ICD-10-CM | POA: Insufficient documentation

## 2012-04-12 DIAGNOSIS — K3189 Other diseases of stomach and duodenum: Secondary | ICD-10-CM | POA: Insufficient documentation

## 2012-04-12 DIAGNOSIS — R1013 Epigastric pain: Secondary | ICD-10-CM

## 2012-04-12 HISTORY — DX: Bipolar disorder, unspecified: F31.9

## 2012-04-12 LAB — COMPREHENSIVE METABOLIC PANEL
ALT: 11 U/L (ref 0–53)
AST: 14 U/L (ref 0–37)
Albumin: 4.7 g/dL (ref 3.5–5.2)
CO2: 22 mEq/L (ref 19–32)
Calcium: 10 mg/dL (ref 8.4–10.5)
Chloride: 103 mEq/L (ref 96–112)
GFR calc non Af Amer: >90 mL/min (ref >90)
Sodium: 139 mEq/L (ref 135–145)

## 2012-04-12 LAB — CBC WITH DIFFERENTIAL/PLATELET
HCT: 46.3 % (ref 39.0–52.0)
Hemoglobin: 16.5 g/dL (ref 13.0–17.0)
Lymphocytes Relative: 26 % (ref 12–46)
Lymphs Abs: 2 10*3/uL (ref 0.7–4.0)
MCV: 89 fL (ref 78.0–100.0)
Monocytes Absolute: 0.8 10*3/uL (ref 0.1–1.0)
Monocytes Relative: 11 % (ref 3–12)
Neutro Abs: 4.6 10*3/uL (ref 1.7–7.7)
WBC: 7.4 10*3/uL (ref 4.0–10.5)

## 2012-04-12 MED ORDER — SODIUM CHLORIDE 0.9 % IV SOLN
Freq: Once | INTRAVENOUS | Status: AC
  Administered 2012-04-12: 15:00:00 via INTRAVENOUS

## 2012-04-12 MED ORDER — PANTOPRAZOLE SODIUM 40 MG IV SOLR
40.0000 mg | Freq: Once | INTRAVENOUS | Status: AC
  Administered 2012-04-12: 40 mg via INTRAVENOUS
  Filled 2012-04-12: qty 40

## 2012-04-12 MED ORDER — HYDROMORPHONE HCL PF 1 MG/ML IJ SOLN
1.0000 mg | Freq: Once | INTRAMUSCULAR | Status: AC
  Administered 2012-04-12: 1 mg via INTRAVENOUS
  Filled 2012-04-12: qty 1

## 2012-04-12 MED ORDER — OMEPRAZOLE 20 MG PO CPDR: 20.0000 mg | DELAYED_RELEASE_CAPSULE | Freq: Every day | ORAL | Status: DC

## 2012-04-12 MED ORDER — ONDANSETRON HCL 4 MG PO TABS: 4.0000 mg | ORAL_TABLET | Freq: Four times a day (QID) | ORAL | Status: DC

## 2012-04-12 NOTE — ED Notes (Signed)
Pt in with c/o upper abd pain x 2.5 weeks and reports changes in his oxcarbine dosage. Reports bloating and pressure. Last BM was this morning and reported as normal. No dysuria.

## 2012-04-12 NOTE — ED Provider Notes (Signed)
History     [name]: [id number]  Arrival date & time 04/12/12  1335   First MD Initiated Contact with Patient 04/12/12 1407      Chief Complaint  Patient presents with  . Abdominal Pain    (Consider location/radiation/quality/duration/timing/severity/associated sxs/prior treatment) Patient is a 31 y.o. male presenting with abdominal pain. The history is provided by the patient.  Abdominal Pain The primary symptoms of the illness include abdominal pain and nausea. The primary symptoms of the illness do not include fever, shortness of breath, vomiting or dysuria.  The abdominal pain began more than 2 days ago. The abdominal pain is located in the epigastric region. The abdominal pain radiates to the back. The abdominal pain is relieved by nothing. The abdominal pain is exacerbated by eating.  The patient has not had a change in bowel habit. Additional symptoms associated with the illness include back pain. Associated symptoms comments: History of similar symptoms but not this intense or this long lasting. Nausea without vomiting. No fever. Today he feels the pain is at its worst and feels his abdomen is distended. No change in bowel habits, no melena. He has taken Mylanta without relief. He denies alcohol use. Marland Kitchen    Past Medical History  Diagnosis Date  . Back pain   . Gastric ulcer, chronic   . Depression   . Anxiety   . Chronic pain   . Arthritis   . Bipolar 1 disorder     History reviewed. No pertinent past surgical history.  History reviewed. No pertinent family history.  History  Substance Use Topics  . Smoking status: Current Every Day Smoker -- 1.0 packs/day for 15 years    Types: Cigarettes  . Smokeless tobacco: Not on file  . Alcohol Use: No      Review of Systems  Constitutional: Negative for fever.  Respiratory: Negative for cough and shortness of breath.   Cardiovascular: Negative for chest pain.  Gastrointestinal: Positive for nausea and abdominal pain.  Negative for vomiting and blood in stool.  Genitourinary: Negative for dysuria.  Musculoskeletal: Positive for back pain.    Allergies  Benadryl; Nyquil; and Tylenol  Home Medications   Current Outpatient Rx  Name Route Sig Dispense Refill  . OXCARBAZEPINE 600 MG PO TABS Oral Take 600 mg by mouth 2 (two) times daily.    . BUSPIRONE HCL 10 MG PO TABS Oral Take 10 mg by mouth 2 (two) times daily.      Marland Kitchen DIAZEPAM 10 MG PO TABS Oral Take 10 mg by mouth daily. anxiety    . GABAPENTIN 300 MG PO CAPS Oral Take 300 mg by mouth 3 (three) times daily.    . OXYCODONE HCL 5 MG PO TABS Oral Take 5-10 mg by mouth daily as needed. pain    . PAROXETINE HCL 10 MG PO TABS Oral Take 10 mg by mouth every morning.    Marland Kitchen RISPERIDONE 2 MG PO TABS Oral Take 2 mg by mouth at bedtime.    Marland Kitchen VITAMIN C 500 MG PO TABS Oral Take 500 mg by mouth daily.      Marland Kitchen ZOLPIDEM TARTRATE 5 MG PO TABS Oral Take 5 mg by mouth at bedtime as needed.      There were no vitals taken for this visit.  Physical Exam  Constitutional: He is oriented to person, place, and time. He appears well-developed and well-nourished.  HENT:  Head: Normocephalic.  Neck: Normal range of motion. Neck supple.  Cardiovascular: Normal rate  and regular rhythm.   Pulmonary/Chest: Effort normal and breath sounds normal.  Abdominal: Soft. He exhibits distension. There is tenderness. There is no rebound and no guarding.       Epigastric tenderness. BS hypoactive.   Musculoskeletal: Normal range of motion.  Neurological: He is alert and oriented to person, place, and time.  Skin: Skin is warm and dry. No rash noted.  Psychiatric: He has a normal mood and affect.    ED Course  Procedures (including critical care time)   Labs Reviewed  CBC WITH DIFFERENTIAL  COMPREHENSIVE METABOLIC PANEL  LIPASE, BLOOD   Results for orders placed during the hospital encounter of 04/12/12  CBC WITH DIFFERENTIAL      Component Value Range   WBC 7.4  4.0 - 10.5  K/uL   RBC 5.20  4.22 - 5.81 MIL/uL   Hemoglobin 16.5  13.0 - 17.0 g/dL   HCT 29.5  62.1 - 30.8 %   MCV 89.0  78.0 - 100.0 fL   MCH 31.7  26.0 - 34.0 pg   MCHC 35.6  30.0 - 36.0 g/dL   RDW 65.7  84.6 - 96.2 %   Platelets 267  150 - 400 K/uL   Neutrophils Relative 62  43 - 77 %   Neutro Abs 4.6  1.7 - 7.7 K/uL   Lymphocytes Relative 26  12 - 46 %   Lymphs Abs 2.0  0.7 - 4.0 K/uL   Monocytes Relative 11  3 - 12 %   Monocytes Absolute 0.8  0.1 - 1.0 K/uL   Eosinophils Relative 1  0 - 5 %   Eosinophils Absolute 0.0  0.0 - 0.7 K/uL   Basophils Relative 0  0 - 1 %   Basophils Absolute 0.0  0.0 - 0.1 K/uL  COMPREHENSIVE METABOLIC PANEL      Component Value Range   Sodium 139  135 - 145 mEq/L   Potassium 4.1  3.5 - 5.1 mEq/L   Chloride 103  96 - 112 mEq/L   CO2 22  19 - 32 mEq/L   Glucose, Bld 107 (*) 70 - 99 mg/dL   BUN 17  6 - 23 mg/dL   Creatinine, Ser 9.52  0.50 - 1.35 mg/dL   Calcium 84.1  8.4 - 32.4 mg/dL   Total Protein 8.0  6.0 - 8.3 g/dL   Albumin 4.7  3.5 - 5.2 g/dL   AST 14  0 - 37 U/L   ALT 11  0 - 53 U/L   Alkaline Phosphatase 60  39 - 117 U/L   Total Bilirubin 0.3  0.3 - 1.2 mg/dL   GFR calc non Af Amer >90  >90 mL/min   GFR calc Af Amer >90  >90 mL/min  LIPASE, BLOOD      Component Value Range   Lipase 24  11 - 59 U/L   Dg Abd Acute W/chest  04/12/2012  *RADIOLOGY REPORT*  Clinical Data: 31 year old male with abdominal pain.  ACUTE ABDOMEN SERIES (ABDOMEN 2 VIEW & CHEST 1 VIEW)  Comparison: 01/27/2012 chest radiograph  Findings: The cardiomediastinal silhouette is unremarkable. The lungs are clear. There is no evidence of airspace disease, pleural effusion or pneumothorax.  The bowel gas pattern is unremarkable. There is no evidence of bowel obstruction or pneumoperitoneum. No suspicious calcifications are identified. The bony structures are unremarkable.  IMPRESSION: Unremarkable exam.   Original Report Authenticated By: Rosendo Gros, M.D.    No results  found.   No diagnosis  found.  1. Dyspepsia   MDM  Pain improved with medications here. No further nausea. Discussed labs and xray results and importance of GI follow up. He states that symptoms may be related to a change in dose of Trileptal, which the patient stopped taking. Recommended talking to his doctor as soon as possible about medication alternative.         Rodena Medin, PA-C 04/12/12 1619

## 2012-04-12 NOTE — ED Notes (Signed)
Patient to [facility] with C/O abdominal distention.  Patient has been taking Trileptal for 6 weeks.  States that his dose was doubled 2 weeks ago, which is when his abdomen started swelling.  C/O generalized abdominal pain with the worse pain in his epigastric region. Abdomen is firm, epigastric area is tender to palpation.  Patient is not eating well, states that eating makes his condition worse. Bowel sounds present X 4 quadrants.  C/O nausea but denies vomiting and diarrhea.  Denies changes in bowel bladder pattern.

## 2012-04-13 NOTE — ED Provider Notes (Signed)
Medical screening examination/treatment/procedure(s) were performed by non-physician practitioner and as supervising physician I was immediately available for consultation/collaboration.   Rolan Bucco, MD 04/13/12 [phone]

## 2012-04-15 ENCOUNTER — Emergency Department (HOSPITAL_BASED_OUTPATIENT_CLINIC_OR_DEPARTMENT_OTHER)
Admission: EM | Admit: 2012-04-15 | Discharge: 2012-04-15 | Disposition: A | Discharge status: Home / Self Care | Payer: Self-pay | Attending: Emergency Medicine | Admitting: Emergency Medicine

## 2012-04-15 ENCOUNTER — Encounter (HOSPITAL_BASED_OUTPATIENT_CLINIC_OR_DEPARTMENT_OTHER): Payer: Self-pay | Admitting: *Deleted

## 2012-04-15 ENCOUNTER — Emergency Department (HOSPITAL_BASED_OUTPATIENT_CLINIC_OR_DEPARTMENT_OTHER): Payer: Self-pay

## 2012-04-15 DIAGNOSIS — F329 Major depressive disorder, single episode, unspecified: Secondary | ICD-10-CM | POA: Insufficient documentation

## 2012-04-15 DIAGNOSIS — G8929 Other chronic pain: Secondary | ICD-10-CM | POA: Insufficient documentation

## 2012-04-15 DIAGNOSIS — R109 Unspecified abdominal pain: Secondary | ICD-10-CM | POA: Insufficient documentation

## 2012-04-15 DIAGNOSIS — F17200 Nicotine dependence, unspecified, uncomplicated: Secondary | ICD-10-CM | POA: Insufficient documentation

## 2012-04-15 DIAGNOSIS — Z79899 Other long term (current) drug therapy: Secondary | ICD-10-CM | POA: Insufficient documentation

## 2012-04-15 DIAGNOSIS — M129 Arthropathy, unspecified: Secondary | ICD-10-CM | POA: Insufficient documentation

## 2012-04-15 DIAGNOSIS — F3289 Other specified depressive episodes: Secondary | ICD-10-CM | POA: Insufficient documentation

## 2012-04-15 DIAGNOSIS — F411 Generalized anxiety disorder: Secondary | ICD-10-CM | POA: Insufficient documentation

## 2012-04-15 DIAGNOSIS — R1110 Vomiting, unspecified: Secondary | ICD-10-CM | POA: Insufficient documentation

## 2012-04-15 DIAGNOSIS — F319 Bipolar disorder, unspecified: Secondary | ICD-10-CM | POA: Insufficient documentation

## 2012-04-15 LAB — CBC WITH DIFFERENTIAL/PLATELET
Eosinophils Absolute: 0.1 10*3/uL (ref 0.0–0.7)
Eosinophils Relative: 1 % (ref 0–5)
HCT: 43.9 % (ref 39.0–52.0)
Hemoglobin: 15.7 g/dL (ref 13.0–17.0)
Lymphs Abs: 2 10*3/uL (ref 0.7–4.0)
MCH: 32 pg (ref 26.0–34.0)
MCV: 89.4 fL (ref 78.0–100.0)
Monocytes Absolute: 1.1 10*3/uL — ABNORMAL HIGH (ref 0.1–1.0)
Monocytes Relative: 12 % (ref 3–12)
Platelets: 237 10*3/uL (ref 150–400)
RBC: 4.91 MIL/uL (ref 4.22–5.81)

## 2012-04-15 LAB — COMPREHENSIVE METABOLIC PANEL
BUN: 11 mg/dL (ref 6–23)
CO2: 24 mEq/L (ref 19–32)
Calcium: 9.6 mg/dL (ref 8.4–10.5)
Creatinine, Ser: 1 mg/dL (ref 0.50–1.35)
GFR calc Af Amer: >90 mL/min (ref >90)
GFR calc non Af Amer: >90 mL/min (ref >90)
Glucose, Bld: 102 mg/dL — ABNORMAL HIGH (ref 70–99)
Total Protein: 7.2 g/dL (ref 6.0–8.3)

## 2012-04-15 LAB — URINALYSIS, ROUTINE W REFLEX MICROSCOPIC
Nitrite: NEGATIVE
Protein, ur: NEGATIVE mg/dL
Specific Gravity, Urine: 1.021 (ref 1.005–1.030)
Urobilinogen, UA: 0.2 mg/dL (ref 0.0–1.0)

## 2012-04-15 LAB — LIPASE, BLOOD: Lipase: 27 U/L (ref 11–59)

## 2012-04-15 MED ORDER — ONDANSETRON HCL 4 MG/2ML IJ SOLN
4.0000 mg | Freq: Once | INTRAMUSCULAR | Status: AC
  Administered 2012-04-15: 4 mg via INTRAVENOUS
  Filled 2012-04-15: qty 2

## 2012-04-15 MED ORDER — ONDANSETRON HCL 4 MG PO TABS: 4.0000 mg | ORAL_TABLET | Freq: Four times a day (QID) | ORAL | Status: DC

## 2012-04-15 MED ORDER — IOHEXOL 300 MG/ML  SOLN: 50.0000 mL | Freq: Once | INTRAMUSCULAR | Status: DC | PRN

## 2012-04-15 MED ORDER — TRAMADOL HCL 50 MG PO TABS: 50.0000 mg | ORAL_TABLET | Freq: Four times a day (QID) | ORAL | Status: DC | PRN

## 2012-04-15 MED ORDER — SODIUM CHLORIDE 0.9 % IV SOLN: Freq: Once | INTRAVENOUS | Status: DC

## 2012-04-15 MED ORDER — HYDROMORPHONE HCL PF 1 MG/ML IJ SOLN
1.0000 mg | Freq: Once | INTRAMUSCULAR | Status: AC
  Administered 2012-04-15: 1 mg via INTRAVENOUS
  Filled 2012-04-15: qty 1

## 2012-04-15 MED ORDER — IOHEXOL 300 MG/ML  SOLN
100.0000 mL | Freq: Once | INTRAMUSCULAR | Status: AC | PRN
  Administered 2012-04-15: 100 mL via INTRAVENOUS

## 2012-04-15 MED ORDER — FAMOTIDINE IN NACL 20-0.9 MG/50ML-% IV SOLN
20.0000 mg | Freq: Once | INTRAVENOUS | Status: AC
  Administered 2012-04-15: 20 mg via INTRAVENOUS
  Filled 2012-04-15: qty 50

## 2012-04-15 NOTE — ED Provider Notes (Signed)
History     [name]: [id number]  Arrival date & time 04/15/12  1219   First MD Initiated Contact with Patient 04/15/12 1425      Chief Complaint  Patient presents with  . Abdominal Pain  . Emesis    (Consider location/radiation/quality/duration/timing/severity/associated sxs/prior treatment) Patient is a 31 y.o. male presenting with abdominal pain. The history is provided by the patient. No language interpreter was used.  Abdominal Pain The primary symptoms of the illness include abdominal pain. The current episode started more than 2 days ago. The onset of the illness was gradual. The problem has been gradually worsening.  Associated with: trileptal. The patient states that she believes she is currently not pregnant. Symptoms associated with the illness do not include chills or constipation. Significant associated medical issues do not include PUD.    Past Medical History  Diagnosis Date  . Back pain   . Gastric ulcer, chronic   . Depression   . Anxiety   . Chronic pain   . Arthritis   . Bipolar 1 disorder     No past surgical history on file.  No family history on file.  History  Substance Use Topics  . Smoking status: Current Every Day Smoker -- 1.0 packs/day for 15 years    Types: Cigarettes  . Smokeless tobacco: Not on file  . Alcohol Use: No      Review of Systems  Constitutional: Negative for chills.  Gastrointestinal: Positive for abdominal pain. Negative for constipation.  All other systems reviewed and are negative.    Allergies  Benadryl; Nyquil; and Tylenol  Home Medications   Current Outpatient Rx  Name Route Sig Dispense Refill  . DIAZEPAM 10 MG PO TABS Oral Take 10 mg by mouth daily. anxiety    . OXCARBAZEPINE 600 MG PO TABS Oral Take 600 mg by mouth 2 (two) times daily.    Marland Kitchen OMEPRAZOLE 20 MG PO CPDR Oral Take 1 capsule (20 mg total) by mouth daily. Take one twice daily for the first 3 days, then once daily after that. 20 capsule 0  .  ONDANSETRON HCL 4 MG PO TABS Oral Take 1 tablet (4 mg total) by mouth every 6 (six) hours. 12 tablet 0    BP 138/87  Pulse 74  Temp 98.4 F (36.9 C) (Oral)  Resp 16  Ht 6' (1.829 m)  Wt 190 lb (86.183 kg)  BMI 25.77 kg/m2  SpO2 100%  Physical Exam  Nursing note and vitals reviewed. Constitutional: He appears well-developed and well-nourished.  HENT:  Head: Normocephalic and atraumatic.  Right Ear: External ear normal.  Left Ear: External ear normal.  Nose: Nose normal.  Mouth/Throat: Oropharynx is clear and moist.  Neck: Normal range of motion. Neck supple.  Cardiovascular: Normal rate and normal heart sounds.   Pulmonary/Chest: Effort normal.  Abdominal: Soft. There is tenderness.       Tender epigastric and lower abdomen  Musculoskeletal: Normal range of motion.  Neurological: He is alert.  Skin: Skin is warm.  Psychiatric: He has a normal mood and affect.    ED Course  Procedures (including critical care time)  Labs Reviewed  CBC WITH DIFFERENTIAL - Abnormal; Notable for the following:    Monocytes Absolute 1.1 (*)     All other components within normal limits  COMPREHENSIVE METABOLIC PANEL - Abnormal; Notable for the following:    Glucose, Bld 102 (*)     All other components within normal limits  LIPASE, BLOOD  URINALYSIS,  ROUTINE W REFLEX MICROSCOPIC   No results found.   1. Abdominal pain       MDM  Pt given iv  Ns x 999, pepcid and pain medication.   Pt had acute abdominal series 2 days ago.   I obtained ct scan today,  Normal scan,  Labs are normal.   Pt advised to follow up with Gi as previously advised.           Lonia Skinner [place], [place] 04/15/12 1736

## 2012-04-15 NOTE — ED Notes (Signed)
Pt c/o abdominal pain.  Pt seen this weekend for same.  Medications given not working.

## 2012-04-15 NOTE — ED Provider Notes (Signed)
Medical screening examination/treatment/procedure(s) were performed by non-physician practitioner and as supervising physician I was immediately available for consultation/collaboration.   Boyd Litaker W. Fillmore Bynum, MD 04/15/12 2254 
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
## 2012-06-20 ENCOUNTER — Encounter (HOSPITAL_BASED_OUTPATIENT_CLINIC_OR_DEPARTMENT_OTHER): Payer: Self-pay | Admitting: *Deleted

## 2012-06-20 DIAGNOSIS — Y929 Unspecified place or not applicable: Secondary | ICD-10-CM | POA: Insufficient documentation

## 2012-06-20 DIAGNOSIS — S61209A Unspecified open wound of unspecified finger without damage to nail, initial encounter: Secondary | ICD-10-CM | POA: Insufficient documentation

## 2012-06-20 DIAGNOSIS — Z79899 Other long term (current) drug therapy: Secondary | ICD-10-CM | POA: Insufficient documentation

## 2012-06-20 DIAGNOSIS — Z8739 Personal history of other diseases of the musculoskeletal system and connective tissue: Secondary | ICD-10-CM | POA: Insufficient documentation

## 2012-06-20 DIAGNOSIS — W260XXA Contact with knife, initial encounter: Secondary | ICD-10-CM | POA: Insufficient documentation

## 2012-06-20 DIAGNOSIS — F329 Major depressive disorder, single episode, unspecified: Secondary | ICD-10-CM | POA: Insufficient documentation

## 2012-06-20 DIAGNOSIS — F411 Generalized anxiety disorder: Secondary | ICD-10-CM | POA: Insufficient documentation

## 2012-06-20 DIAGNOSIS — F17200 Nicotine dependence, unspecified, uncomplicated: Secondary | ICD-10-CM | POA: Insufficient documentation

## 2012-06-20 DIAGNOSIS — F319 Bipolar disorder, unspecified: Secondary | ICD-10-CM | POA: Insufficient documentation

## 2012-06-20 DIAGNOSIS — W261XXA Contact with sword or dagger, initial encounter: Secondary | ICD-10-CM | POA: Insufficient documentation

## 2012-06-20 DIAGNOSIS — G8929 Other chronic pain: Secondary | ICD-10-CM | POA: Insufficient documentation

## 2012-06-20 DIAGNOSIS — Z8711 Personal history of peptic ulcer disease: Secondary | ICD-10-CM | POA: Insufficient documentation

## 2012-06-20 DIAGNOSIS — F3289 Other specified depressive episodes: Secondary | ICD-10-CM | POA: Insufficient documentation

## 2012-06-20 DIAGNOSIS — Y9389 Activity, other specified: Secondary | ICD-10-CM | POA: Insufficient documentation

## 2012-06-20 NOTE — ED Notes (Signed)
Pt c/o laceration to 3rd finger with knife x 30 mins ago

## 2012-06-21 ENCOUNTER — Emergency Department (HOSPITAL_BASED_OUTPATIENT_CLINIC_OR_DEPARTMENT_OTHER): Payer: Self-pay

## 2012-06-21 ENCOUNTER — Emergency Department (HOSPITAL_BASED_OUTPATIENT_CLINIC_OR_DEPARTMENT_OTHER)
Admission: EM | Admit: 2012-06-21 | Discharge: 2012-06-21 | Disposition: A | Discharge status: Home / Self Care | Payer: Self-pay | Attending: Emergency Medicine | Admitting: Emergency Medicine

## 2012-06-21 DIAGNOSIS — S61219A Laceration without foreign body of unspecified finger without damage to nail, initial encounter: Secondary | ICD-10-CM

## 2012-06-21 MED ORDER — IBUPROFEN 800 MG PO TABS
800.0000 mg | ORAL_TABLET | Freq: Once | ORAL | Status: AC
  Administered 2012-06-21: 800 mg via ORAL
  Filled 2012-06-21: qty 1

## 2012-06-21 MED ORDER — LIDOCAINE HCL 2 % IJ SOLN
20.0000 mL | Freq: Once | INTRAMUSCULAR | Status: AC
  Administered 2012-06-21: 400 mg

## 2012-06-21 MED ORDER — TRAMADOL HCL 50 MG PO TABS: 50.0000 mg | ORAL_TABLET | Freq: Four times a day (QID) | ORAL | Status: DC | PRN

## 2012-06-21 MED ORDER — LIDOCAINE HCL 2 % IJ SOLN
INTRAMUSCULAR | Status: AC
  Administered 2012-06-21: 400 mg
  Filled 2012-06-21: qty 20

## 2012-06-21 MED ORDER — LIDOCAINE-EPINEPHRINE 2 %-1:100000 IJ SOLN: 20.0000 mL | Freq: Once | INTRAMUSCULAR | Status: DC

## 2012-06-21 NOTE — ED Notes (Signed)
MD at bedside. 

## 2012-06-21 NOTE — ED Notes (Signed)
Patient transported to X-ray 

## 2012-06-21 NOTE — ED Provider Notes (Signed)
History     [name]: [id number]  Arrival date & time 06/20/12  2318   First MD Initiated Contact with Patient 06/21/12 [phone]      Chief Complaint  Patient presents with  . Laceration    (Consider location/radiation/quality/duration/timing/severity/associated sxs/prior treatment) The history is provided by the patient.  Casey Mercer is a 32 y.o. male here with L hand laceration. Was opening something with his new knife and slipped and accidentally cut his L hand. He injured L 4th and 5th fingers. Has a lot pain and bleeding afterwards. Tetanus 2 years ago. No other injuries.    Past Medical History  Diagnosis Date  . Back pain   . Gastric ulcer, chronic   . Depression   . Anxiety   . Chronic pain   . Arthritis   . Bipolar 1 disorder     History reviewed. No pertinent past surgical history.  History reviewed. No pertinent family history.  History  Substance Use Topics  . Smoking status: Current Every Day Smoker -- 1.0 packs/day for 15 years    Types: Cigarettes  . Smokeless tobacco: Not on file  . Alcohol Use: No      Review of Systems  Skin: Positive for wound.  All other systems reviewed and are negative.    Allergies  Benadryl; Nyquil; and Tylenol  Home Medications   Current Outpatient Rx  Name  Route  Sig  Dispense  Refill  . DULOXETINE HCL 30 MG PO CPEP   Oral   Take 30 mg by mouth daily.         Marland Kitchen GABAPENTIN 300 MG PO CAPS   Oral   Take 300 mg by mouth 3 (three) times daily.         Marland Kitchen PANTOPRAZOLE SODIUM 20 MG PO TBEC   Oral   Take 20 mg by mouth daily.         Marland Kitchen DIAZEPAM 10 MG PO TABS   Oral   Take 10 mg by mouth daily. anxiety         . OMEPRAZOLE 20 MG PO CPDR   Oral   Take 1 capsule (20 mg total) by mouth daily. Take one twice daily for the first 3 days, then once daily after that.   20 capsule   0   . ONDANSETRON HCL 4 MG PO TABS   Oral   Take 1 tablet (4 mg total) by mouth every 6 (six) hours.   12 tablet   0   .  ONDANSETRON HCL 4 MG PO TABS   Oral   Take 1 tablet (4 mg total) by mouth every 6 (six) hours.   20 tablet   0   . OXCARBAZEPINE 600 MG PO TABS   Oral   Take 600 mg by mouth 2 (two) times daily.         . TRAMADOL HCL 50 MG PO TABS   Oral   Take 1 tablet (50 mg total) by mouth every 6 (six) hours as needed for pain.   15 tablet   0     BP 144/73  Pulse 76  Temp 97.4 F (36.3 C) (Oral)  Resp 20  Ht 6' (1.829 m)  Wt 190 lb (86.183 kg)  BMI 25.77 kg/m2  SpO2 100%  Physical Exam  Nursing note and vitals reviewed. Constitutional: He is oriented to person, place, and time. He appears well-developed and well-nourished.  HENT:  Head: Normocephalic.  Eyes: Pupils are equal, round, and  reactive to light.  Neck: Normal range of motion.  Cardiovascular: Normal rate.   Pulmonary/Chest: Effort normal.  Musculoskeletal: Normal range of motion.       Hands:      1 cm laceration on the palmer aspect of L 4th finger. There is a small laceration (0.5 cm )on the L 4th finger on the ulnar side. Small (0.5 cm) on the L 5th finger on the radius side. No visualized tendons. Able to range the fingers normally and extend and flex well. 2+ pulses. Good cap refill.   Neurological: He is alert and oriented to person, place, and time.  Skin: Skin is warm.  Psychiatric: He has a normal mood and affect. His behavior is normal. Judgment and thought content normal.    ED Course  Procedures (including critical care time)  LACERATION REPAIR Performed by: Chaney Malling Authorized by: Chaney Malling Consent: Verbal consent obtained. Risks and benefits: risks, benefits and alternatives were discussed Consent given by: patient Patient identity confirmed: provided demographic data Prepped and Draped in normal sterile fashion Wound explored  Laceration Location: L 4th and 5th fingers as noted above  Laceration Length: See above   No Foreign Bodies seen or palpated  Anesthesia: local  infiltration  Local anesthetic: lidocaine 2% no epinephrine  Anesthetic total: 5 ml  Irrigation method: syringe Amount of cleaning: standard  Skin closure: 5-0 Ethilon  Number of sutures: 5  Technique: simple interrupted   Patient tolerance: Patient tolerated the procedure well with no immediate complications. Dressing applied.    Labs Reviewed - No data to display Dg Hand Complete Left  06/21/2012  *RADIOLOGY REPORT*  Clinical Data: Laceration ring finger  LEFT HAND - COMPLETE 3+ VIEW  Comparison: None  Findings: Fingers flexed on all views slightly limiting assessment. Osseous mineralization normal. Joint spaces preserved. No acute fracture, dislocation or bone destruction. No definite radiopaque foreign bodies or soft tissue gas.  IMPRESSION: No acute osseous abnormalities identified.   Original Report Authenticated By: Ulyses Southward, M.D.      No diagnosis found.    MDM  Casey Mercer is a 32 y.o. male here with finger laceration. Laceration repaired above. Return in 7 days for suture removal.         Richardean Canal, MD 06/21/12 [phone]

## 2012-07-15 ENCOUNTER — Emergency Department (HOSPITAL_COMMUNITY)
Admission: EM | Admit: 2012-07-15 | Discharge: 2012-07-16 | Disposition: A | Discharge status: Other Acute Inpatient Hospital | Payer: Self-pay | Attending: Emergency Medicine | Admitting: Emergency Medicine

## 2012-07-15 ENCOUNTER — Encounter (HOSPITAL_COMMUNITY): Payer: Self-pay | Admitting: *Deleted

## 2012-07-15 ENCOUNTER — Ambulatory Visit (HOSPITAL_COMMUNITY)
Admission: RE | Admit: 2012-07-15 | Discharge: 2012-07-15 | Disposition: A | Discharge status: Home / Self Care | Payer: Self-pay | Attending: Psychiatry | Admitting: Psychiatry

## 2012-07-15 DIAGNOSIS — F29 Unspecified psychosis not due to a substance or known physiological condition: Secondary | ICD-10-CM | POA: Insufficient documentation

## 2012-07-15 DIAGNOSIS — M129 Arthropathy, unspecified: Secondary | ICD-10-CM | POA: Insufficient documentation

## 2012-07-15 DIAGNOSIS — F410 Panic disorder [episodic paroxysmal anxiety] without agoraphobia: Secondary | ICD-10-CM | POA: Insufficient documentation

## 2012-07-15 DIAGNOSIS — M549 Dorsalgia, unspecified: Secondary | ICD-10-CM | POA: Insufficient documentation

## 2012-07-15 DIAGNOSIS — F17200 Nicotine dependence, unspecified, uncomplicated: Secondary | ICD-10-CM | POA: Insufficient documentation

## 2012-07-15 DIAGNOSIS — F39 Unspecified mood [affective] disorder: Secondary | ICD-10-CM | POA: Insufficient documentation

## 2012-07-15 DIAGNOSIS — G8929 Other chronic pain: Secondary | ICD-10-CM | POA: Insufficient documentation

## 2012-07-15 DIAGNOSIS — K257 Chronic gastric ulcer without hemorrhage or perforation: Secondary | ICD-10-CM | POA: Insufficient documentation

## 2012-07-15 DIAGNOSIS — F319 Bipolar disorder, unspecified: Secondary | ICD-10-CM | POA: Insufficient documentation

## 2012-07-15 HISTORY — DX: Panic disorder (episodic paroxysmal anxiety): F41.0

## 2012-07-15 LAB — COMPREHENSIVE METABOLIC PANEL
ALT: 14 U/L (ref 0–53)
AST: 15 U/L (ref 0–37)
Albumin: 4.5 g/dL (ref 3.5–5.2)
CO2: 23 mEq/L (ref 19–32)
Calcium: 9.9 mg/dL (ref 8.4–10.5)
Creatinine, Ser: 0.88 mg/dL (ref 0.50–1.35)
Sodium: 139 mEq/L (ref 135–145)
Total Protein: 7.9 g/dL (ref 6.0–8.3)

## 2012-07-15 LAB — URINALYSIS, ROUTINE W REFLEX MICROSCOPIC
Glucose, UA: NEGATIVE mg/dL
Leukocytes, UA: NEGATIVE
Nitrite: NEGATIVE
Specific Gravity, Urine: 1.015 (ref 1.005–1.030)
pH: 8 (ref 5.0–8.0)

## 2012-07-15 LAB — CBC WITH DIFFERENTIAL/PLATELET
Basophils Absolute: 0 10*3/uL (ref 0.0–0.1)
Basophils Relative: 0 % (ref 0–1)
Eosinophils Relative: 1 % (ref 0–5)
HCT: 48.6 % (ref 39.0–52.0)
Lymphocytes Relative: 22 % (ref 12–46)
MCHC: 35.6 g/dL (ref 30.0–36.0)
MCV: 90.7 fL (ref 78.0–100.0)
Monocytes Absolute: 0.7 10*3/uL (ref 0.1–1.0)
Neutro Abs: 6.2 10*3/uL (ref 1.7–7.7)
Platelets: 275 10*3/uL (ref 150–400)
RDW: 13 % (ref 11.5–15.5)
WBC: 8.8 10*3/uL (ref 4.0–10.5)

## 2012-07-15 LAB — ETHANOL: Alcohol, Ethyl (B): <11 mg/dL (ref 0–11)

## 2012-07-15 LAB — SALICYLATE LEVEL: Salicylate Lvl: <2 mg/dL — ABNORMAL LOW (ref 2.8–20.0)

## 2012-07-15 LAB — RAPID URINE DRUG SCREEN, HOSP PERFORMED: Barbiturates: NOT DETECTED

## 2012-07-15 MED ORDER — ONDANSETRON HCL 4 MG PO TABS
4.0000 mg | ORAL_TABLET | Freq: Three times a day (TID) | ORAL | Status: DC | PRN
  Administered 2012-07-15 – 2012-07-16 (×2): 4 mg via ORAL
  Filled 2012-07-15 (×2): qty 1

## 2012-07-15 MED ORDER — NICOTINE 21 MG/24HR TD PT24
21.0000 mg | MEDICATED_PATCH | Freq: Every day | TRANSDERMAL | Status: DC
  Administered 2012-07-15 – 2012-07-16 (×2): 21 mg via TRANSDERMAL
  Filled 2012-07-15 (×2): qty 1

## 2012-07-15 MED ORDER — LORAZEPAM 1 MG PO TABS
1.0000 mg | ORAL_TABLET | Freq: Three times a day (TID) | ORAL | Status: DC | PRN
  Administered 2012-07-15: 1 mg via ORAL
  Filled 2012-07-15 (×2): qty 1

## 2012-07-15 NOTE — Progress Notes (Signed)
Offered support. Patient cold. With doctor's permission gave him a blanket. Patient is Catholic. He requested prayer; prayed. He also wants to do his rosary, but he said he "was having trouble remembering things right now" and asked for the prayers. Found him a copy and brought it to him.
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
## 2012-07-15 NOTE — ED Provider Notes (Signed)
History     [name]: [id number]  Arrival date & time 07/15/12  1409   First MD Initiated Contact with Patient 07/15/12 1454      Chief Complaint  Patient presents with  . Medical Clearance    (Consider location/radiation/quality/duration/timing/severity/associated sxs/prior treatment) The history is provided by the patient.  pt with hx bipolar disorder, c/o anxiety and very depressed for past month. States slowly worse. Denies specific inciting or aggravating event, states problem is everything. Does not hx chronic back pain from prior injury, not working, money issues, relationship issues. Denies etoh or drug use. 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  States hears voices/whispers - not sure what is saying to him. Denies thoughts of harm to self or others, but states feels on edge all of the time. Denies overdose or overuse of meds. States is compliant w rx but doesn't help. No recent acute health problems or symptoms.   Past Medical History  Diagnosis Date  . Back pain   . Gastric ulcer, chronic   . Depression   . Anxiety   . Chronic pain   . Arthritis   . Bipolar 1 disorder   . Panic attacks     Past Surgical History  Procedure Date  . None     History reviewed. No pertinent family history.  History  Substance Use Topics  . Smoking status: Current Every Day Smoker -- 1.0 packs/day for 15 years    Types: Cigarettes  . Smokeless tobacco: Never Used  . Alcohol Use: No      Review of Systems  Constitutional: Negative for fever and chills.  HENT: Negative for neck pain.   Eyes: Negative for redness.  Respiratory: Negative for cough and shortness of breath.   Cardiovascular: Negative for chest pain.  Gastrointestinal: Negative for abdominal pain.  Genitourinary: Negative for flank pain.  Musculoskeletal: Positive for back pain.  Skin: Negative for rash.  Neurological: Negative for headaches.  Hematological: Does not bruise/bleed easily.    Psychiatric/Behavioral: Positive for dysphoric mood.    Allergies  Benadryl; Nyquil; and Tylenol  Home Medications   Current Outpatient Rx  Name  Route  Sig  Dispense  Refill  . DULOXETINE HCL 60 MG PO CPEP   Oral   Take 60 mg by mouth daily.         Marland Kitchen GABAPENTIN 300 MG PO CAPS   Oral   Take 300 mg by mouth 3 (three) times daily.         Marland Kitchen PANTOPRAZOLE SODIUM 40 MG PO TBEC   Oral   Take 40 mg by mouth daily.         Marland Kitchen SIMVASTATIN 20 MG PO TABS   Oral   Take 20 mg by mouth at bedtime.           BP 140/87  Pulse 78  Temp 98.5 F (36.9 C) (Oral)  Resp 18  SpO2 100%  Physical Exam  Nursing note and vitals reviewed. Constitutional: He is oriented to person, place, and time. He appears well-developed and well-nourished. No distress.  HENT:  Head: Atraumatic.  Nose: Nose normal.  Mouth/Throat: Oropharynx is clear and moist.  Eyes: Conjunctivae normal are normal. Pupils are equal, round, and reactive to light.  Neck: Neck supple. No tracheal deviation present.  Cardiovascular: Normal rate, regular rhythm, normal heart sounds and intact distal pulses.   Pulmonary/Chest: Effort normal and breath sounds normal. No accessory muscle usage. No respiratory distress.  Abdominal:  Soft. Bowel sounds are normal. He exhibits no distension. There is no tenderness.  Musculoskeletal: Normal range of motion. He exhibits no edema and no tenderness.  Neurological: He is alert and oriented to person, place, and time.       Steady gait  Skin: Skin is warm and dry.  Psychiatric:       Depressed mood. Appears anxious at times.     ED Course  Procedures (including critical care time)   Results for orders placed during the hospital encounter of 07/15/12  ETHANOL      Component Value Range   Alcohol, Ethyl (B) <11  0 - 11 mg/dL  CBC WITH DIFFERENTIAL      Component Value Range   WBC 8.8  4.0 - 10.5 K/uL   RBC 5.36  4.22 - 5.81 MIL/uL   Hemoglobin 17.3 (*) 13.0 - 17.0 g/dL    HCT 56.2  13.0 - 86.5 %   MCV 90.7  78.0 - 100.0 fL   MCH 32.3  26.0 - 34.0 pg   MCHC 35.6  30.0 - 36.0 g/dL   RDW 78.4  69.6 - 29.5 %   Platelets 275  150 - 400 K/uL   Neutrophils Relative 70  43 - 77 %   Neutro Abs 6.2  1.7 - 7.7 K/uL   Lymphocytes Relative 22  12 - 46 %   Lymphs Abs 1.9  0.7 - 4.0 K/uL   Monocytes Relative 8  3 - 12 %   Monocytes Absolute 0.7  0.1 - 1.0 K/uL   Eosinophils Relative 1  0 - 5 %   Eosinophils Absolute 0.0  0.0 - 0.7 K/uL   Basophils Relative 0  0 - 1 %   Basophils Absolute 0.0  0.0 - 0.1 K/uL  COMPREHENSIVE METABOLIC PANEL      Component Value Range   Sodium 139  135 - 145 mEq/L   Potassium 4.7  3.5 - 5.1 mEq/L   Chloride 104  96 - 112 mEq/L   CO2 23  19 - 32 mEq/L   Glucose, Bld 108 (*) 70 - 99 mg/dL   BUN 16  6 - 23 mg/dL   Creatinine, Ser 2.84  0.50 - 1.35 mg/dL   Calcium 9.9  8.4 - 13.2 mg/dL   Total Protein 7.9  6.0 - 8.3 g/dL   Albumin 4.5  3.5 - 5.2 g/dL   AST 15  0 - 37 U/L   ALT 14  0 - 53 U/L   Alkaline Phosphatase 62  39 - 117 U/L   Total Bilirubin 0.4  0.3 - 1.2 mg/dL   GFR calc non Af Amer >90  >90 mL/min   GFR calc Af Amer >90  >90 mL/min  ACETAMINOPHEN LEVEL      Component Value Range   Acetaminophen (Tylenol), Serum <15.0  10 - 30 ug/mL  SALICYLATE LEVEL      Component Value Range   Salicylate Lvl <2.0 (*) 2.8 - 20.0 mg/dL  URINE RAPID DRUG SCREEN (HOSP PERFORMED)      Component Value Range   Opiates NONE DETECTED  NONE DETECTED   Cocaine NONE DETECTED  NONE DETECTED   Benzodiazepines NONE DETECTED  NONE DETECTED   Amphetamines PENDING  NONE DETECTED   Tetrahydrocannabinol POSITIVE (*) NONE DETECTED   Barbiturates NONE DETECTED  NONE DETECTED  URINALYSIS, ROUTINE W REFLEX MICROSCOPIC      Component Value Range   Color, Urine YELLOW  YELLOW   APPearance CLEAR  CLEAR   Specific Gravity, Urine 1.015  1.005 - 1.030   pH 8.0  5.0 - 8.0   Glucose, UA NEGATIVE  NEGATIVE mg/dL   Hgb urine dipstick NEGATIVE  NEGATIVE    Bilirubin Urine NEGATIVE  NEGATIVE   Ketones, ur NEGATIVE  NEGATIVE mg/dL   Protein, ur NEGATIVE  NEGATIVE mg/dL   Urobilinogen, UA 0.2  0.0 - 1.0 mg/dL   Nitrite NEGATIVE  NEGATIVE   Leukocytes, UA NEGATIVE  NEGATIVE    MDM  Labs.   Reviewed nursing notes and prior charts for additional history.    Act team called re ?[facility] from eval there just prior to being sent to ed, vs needing eval/placement - they will follow up with [facility] re pt disposition.          Suzi Roots, MD 07/15/12 [phone]
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
## 2012-07-15 NOTE — ED Notes (Signed)
[place] with Security in to wand pt and pt's personal belongings.

## 2012-07-16 ENCOUNTER — Encounter (HOSPITAL_COMMUNITY): Payer: Self-pay | Admitting: Behavioral Health

## 2012-07-16 ENCOUNTER — Inpatient Hospital Stay (HOSPITAL_COMMUNITY)
Admission: AD | Admit: 2012-07-16 | Discharge: 2012-07-21 | DRG: 885 | Disposition: A | Discharge status: Home / Self Care | Payer: Federal, State, Local not specified - Other | Source: Intra-hospital | Attending: Emergency Medicine | Admitting: Emergency Medicine

## 2012-07-16 DIAGNOSIS — F411 Generalized anxiety disorder: Secondary | ICD-10-CM | POA: Diagnosis present

## 2012-07-16 DIAGNOSIS — F341 Dysthymic disorder: Secondary | ICD-10-CM

## 2012-07-16 DIAGNOSIS — F3130 Bipolar disorder, current episode depressed, mild or moderate severity, unspecified: Principal | ICD-10-CM | POA: Diagnosis present

## 2012-07-16 DIAGNOSIS — Z79899 Other long term (current) drug therapy: Secondary | ICD-10-CM

## 2012-07-16 DIAGNOSIS — Z8669 Personal history of other diseases of the nervous system and sense organs: Secondary | ICD-10-CM

## 2012-07-16 DIAGNOSIS — E119 Type 2 diabetes mellitus without complications: Secondary | ICD-10-CM

## 2012-07-16 DIAGNOSIS — F29 Unspecified psychosis not due to a substance or known physiological condition: Secondary | ICD-10-CM

## 2012-07-16 DIAGNOSIS — S53449A Ulnar collateral ligament sprain of unspecified elbow, initial encounter: Secondary | ICD-10-CM

## 2012-07-16 DIAGNOSIS — S43499A Other sprain of unspecified shoulder joint, initial encounter: Secondary | ICD-10-CM

## 2012-07-16 DIAGNOSIS — S60221A Contusion of right hand, initial encounter: Secondary | ICD-10-CM

## 2012-07-16 DIAGNOSIS — M719 Bursopathy, unspecified: Secondary | ICD-10-CM

## 2012-07-16 DIAGNOSIS — G8929 Other chronic pain: Secondary | ICD-10-CM | POA: Diagnosis present

## 2012-07-16 DIAGNOSIS — F1994 Other psychoactive substance use, unspecified with psychoactive substance-induced mood disorder: Secondary | ICD-10-CM | POA: Diagnosis present

## 2012-07-16 DIAGNOSIS — M549 Dorsalgia, unspecified: Secondary | ICD-10-CM | POA: Diagnosis present

## 2012-07-16 DIAGNOSIS — F1210 Cannabis abuse, uncomplicated: Secondary | ICD-10-CM | POA: Diagnosis present

## 2012-07-16 HISTORY — DX: Other psychoactive substance use, unspecified with psychoactive substance-induced mood disorder: F19.94

## 2012-07-16 MED ORDER — PANTOPRAZOLE SODIUM 40 MG PO TBEC
40.0000 mg | DELAYED_RELEASE_TABLET | Freq: Every day | ORAL | Status: DC
  Administered 2012-07-16: 40 mg via ORAL
  Filled 2012-07-16: qty 1

## 2012-07-16 MED ORDER — DULOXETINE HCL 60 MG PO CPEP
60.0000 mg | ORAL_CAPSULE | Freq: Every day | ORAL | Status: DC
  Administered 2012-07-16: 60 mg via ORAL
  Filled 2012-07-16: qty 1

## 2012-07-16 MED ORDER — SIMVASTATIN 20 MG PO TABS
20.0000 mg | ORAL_TABLET | Freq: Every day | ORAL | Status: DC
  Administered 2012-07-16: 20 mg via ORAL
  Filled 2012-07-16 (×2): qty 1

## 2012-07-16 MED ORDER — NICOTINE 21 MG/24HR TD PT24
21.0000 mg | MEDICATED_PATCH | Freq: Every day | TRANSDERMAL | Status: DC
  Filled 2012-07-16 (×2): qty 1

## 2012-07-16 MED ORDER — NICOTINE POLACRILEX 2 MG MT GUM
2.0000 mg | CHEWING_GUM | OROMUCOSAL | Status: DC | PRN
  Administered 2012-07-17 – 2012-07-21 (×13): 2 mg via ORAL

## 2012-07-16 MED ORDER — PANTOPRAZOLE SODIUM 40 MG PO TBEC
40.0000 mg | DELAYED_RELEASE_TABLET | Freq: Every day | ORAL | Status: DC
  Administered 2012-07-17 – 2012-07-21 (×5): 40 mg via ORAL
  Filled 2012-07-16 (×8): qty 1

## 2012-07-16 MED ORDER — KETOROLAC TROMETHAMINE 60 MG/2ML IM SOLN
60.0000 mg | Freq: Two times a day (BID) | INTRAMUSCULAR | Status: DC
  Administered 2012-07-16: 60 mg via INTRAMUSCULAR
  Filled 2012-07-16 (×2): qty 2

## 2012-07-16 MED ORDER — MAGNESIUM HYDROXIDE 400 MG/5ML PO SUSP
30.0000 mL | Freq: Every day | ORAL | Status: DC | PRN
  Administered 2012-07-18: 30 mL via ORAL

## 2012-07-16 MED ORDER — QUETIAPINE FUMARATE 50 MG PO TABS
50.0000 mg | ORAL_TABLET | Freq: Every evening | ORAL | Status: DC | PRN
  Administered 2012-07-16: 50 mg via ORAL
  Filled 2012-07-16 (×7): qty 1

## 2012-07-16 MED ORDER — GABAPENTIN 300 MG PO CAPS
300.0000 mg | ORAL_CAPSULE | Freq: Three times a day (TID) | ORAL | Status: DC
  Administered 2012-07-16 (×3): 300 mg via ORAL
  Filled 2012-07-16 (×4): qty 1

## 2012-07-16 MED ORDER — LORAZEPAM 1 MG PO TABS
1.0000 mg | ORAL_TABLET | Freq: Two times a day (BID) | ORAL | Status: DC
  Administered 2012-07-16 (×2): 1 mg via ORAL
  Filled 2012-07-16: qty 1

## 2012-07-16 MED ORDER — ALUM & MAG HYDROXIDE-SIMETH 200-200-20 MG/5ML PO SUSP: 30.0000 mL | ORAL | Status: DC | PRN

## 2012-07-16 MED ORDER — SIMVASTATIN 20 MG PO TABS
20.0000 mg | ORAL_TABLET | Freq: Every day | ORAL | Status: DC
  Administered 2012-07-16 – 2012-07-20 (×5): 20 mg via ORAL
  Filled 2012-07-16 (×8): qty 1

## 2012-07-16 MED ORDER — ZIPRASIDONE HCL 20 MG PO CAPS
20.0000 mg | ORAL_CAPSULE | Freq: Two times a day (BID) | ORAL | Status: DC
  Administered 2012-07-16: 20 mg via ORAL
  Filled 2012-07-16: qty 1

## 2012-07-16 MED ORDER — ZIPRASIDONE MESYLATE 20 MG IM SOLR
10.0000 mg | Freq: Once | INTRAMUSCULAR | Status: AC
  Administered 2012-07-16: 10 mg via INTRAMUSCULAR
  Filled 2012-07-16: qty 20

## 2012-07-16 MED ORDER — IBUPROFEN 600 MG PO TABS
600.0000 mg | ORAL_TABLET | Freq: Four times a day (QID) | ORAL | Status: DC | PRN
  Administered 2012-07-17 – 2012-07-21 (×7): 600 mg via ORAL
  Filled 2012-07-16 (×2): qty 1
  Filled 2012-07-16: qty 3
  Filled 2012-07-16 (×4): qty 1

## 2012-07-16 MED ORDER — GABAPENTIN 300 MG PO CAPS
300.0000 mg | ORAL_CAPSULE | Freq: Three times a day (TID) | ORAL | Status: DC
  Administered 2012-07-17 – 2012-07-21 (×14): 300 mg via ORAL
  Filled 2012-07-16 (×22): qty 1

## 2012-07-16 MED ORDER — DULOXETINE HCL 60 MG PO CPEP
60.0000 mg | ORAL_CAPSULE | Freq: Every day | ORAL | Status: DC
  Administered 2012-07-17 – 2012-07-18 (×2): 60 mg via ORAL
  Filled 2012-07-16 (×5): qty 1

## 2012-07-16 NOTE — Progress Notes (Signed)
D: Patient has been isolating in his room tonight.  Patient states he is tired and just wants to sleep tonight.  Patient did get up out of med to get his medications.  Patient denies SI/HI but states he hears mumbles but cannot make out what the voices are saying.  Patient adjusting to the unit patient not interacting with peers. A: Staff to monitor Q 15 mins for safety.  Encouragement and support offered.  Scheduled medications administered per orders. R: Patient remains safe on the unit.  Patient did not attend group tonight because he missed it due to the admission process.  Patient taking administered medications.
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
## 2012-07-16 NOTE — [facility] Counselor (Signed)
Patient to come to [facility] between 5pm and 5:30pm. Patients room #505-1. The PA Donell Sievert accepted the patient and he is to go to Dr. Daleen Bo.  *This writers shift has ended for the day and ongoing staff will need to complete the support paperwork before patient is discharged from the ED.

## 2012-07-16 NOTE — ED Notes (Signed)
Taking shower

## 2012-07-16 NOTE — ED Notes (Signed)
Throwing up, did not eat lunch only a couple bites of peaches, trash can emptied

## 2012-07-16 NOTE — Progress Notes (Signed)
[facility] ED CM noted pt listed with Dr Daphine Deutscher [facility] CM spoke with pt who states he now goes to [facility] services of the [place] for pcp serves EPIC updated.  Cm spoke with Madison Hickman, [facility] who states concerns with medical clearance Noted Hgb 17.3 and drug screen positive for Tetrahydrocannabinol only on ed clinical summary CM spoke with EDP [place] See EDP updated note on pt for medical clearance.  Cm confirmed he sees Dr Ethelene Browns at  [facility] of the [place] with Shanda Bumps at [phone]

## 2012-07-16 NOTE — ED Notes (Signed)
MD at bedside. 

## 2012-07-16 NOTE — ED Notes (Signed)
Pt requesting to have Toradol added to his file for medications that help him with pain.

## 2012-07-16 NOTE — ED Notes (Signed)
Sleeping from injection earlier, very pleasant and cooperative

## 2012-07-16 NOTE — ED Notes (Signed)
Denies Si or Hi but does endorse hearing voices and seeing things.

## 2012-07-16 NOTE — ED Provider Notes (Addendum)
Pt recommended for inpt: 400hallway bed.  Waiting availablility.  Currently on home meds.  Pt is c/o of not sleeping, hearing voices and his skin crawling.  He states the ativan is not working.  He is cooperative but states the hallucinations are driving him crazy.  Currently not on any antipsychotics.  Will give IM geodon to see if that helps.  PT IS MEDICALLY CLEAR  Gwyneth Sprout, MD 07/16/12 1610  Gwyneth Sprout, MD 07/16/12 1250

## 2012-07-17 ENCOUNTER — Encounter (HOSPITAL_COMMUNITY): Payer: Self-pay | Admitting: *Deleted

## 2012-07-17 ENCOUNTER — Inpatient Hospital Stay (HOSPITAL_COMMUNITY): Payer: Federal, State, Local not specified - Other

## 2012-07-17 DIAGNOSIS — F329 Major depressive disorder, single episode, unspecified: Secondary | ICD-10-CM

## 2012-07-17 DIAGNOSIS — F411 Generalized anxiety disorder: Secondary | ICD-10-CM

## 2012-07-17 MED ORDER — QUETIAPINE FUMARATE 50 MG PO TABS
50.0000 mg | ORAL_TABLET | Freq: Two times a day (BID) | ORAL | Status: DC
  Administered 2012-07-17 – 2012-07-18 (×2): 50 mg via ORAL
  Filled 2012-07-17 (×4): qty 1

## 2012-07-17 MED ORDER — ONDANSETRON 4 MG PO TBDP: 4.0000 mg | ORAL_TABLET | Freq: Four times a day (QID) | ORAL | Status: DC | PRN

## 2012-07-17 NOTE — Progress Notes (Signed)
[facility] LCSW Group Therapy  [facility] 1:15 - 2:30  07/17/2012 3:24 PM  Type of Therapy:  Group Therapy  Participation Level:  Active  Participation Quality:  Appropriate and Attentive  Affect:  Appropriate, Blunted, Depressed and Flat  Cognitive:  Alert and Appropriate  Insight:  Developing/Improving  Engagement in Therapy:  Engaged  Modes of Intervention:  Clarification, Discussion, Education, Exploration, Problem-solving, Rapport Building and Support  Summary of Progress/Problems: Patient was very engaged in group.  He shared how much he would like to someday be like the speaker and stand before group of people to share his story.  Patient shared that it is very difficult for him to keep his life together and wonders if things will ever get better.  He shared an interest in programming at [facility] but does not have transportation at this timed.  Wynn Banker 07/17/2012, 3:24 PM

## 2012-07-17 NOTE — ED Provider Notes (Signed)
History  Scribed for [name], PA-C/ Raeford Razor, MD, the patient was seen in room WTR9/WTR9. This chart was scribed by Candelaria Stagers. The patient's care started at 9:28 PM   [name]: [id number]  Arrival date & time 07/17/12  2045   First MD Initiated Contact with Patient 07/17/12 2058      Chief Complaint  Patient presents with  . Hand Injury     The history is provided by the patient. No language interpreter was used.   Casey Mercer is a 32 y.o. male who presents to the Emergency Department complaining of right hand and right forearm pain after punching a wall earlier today when he got upset. They sent him here for evaluation because he is complaining of pain.  He is currently a pt in behavioral health.  Nothing seems to make the sx better or worse.    nad vss Past Medical History  Diagnosis Date  . Back pain   . Gastric ulcer, chronic   . Depression   . Anxiety   . Chronic pain   . Arthritis   . Bipolar 1 disorder   . Panic attacks     Past Surgical History  Procedure Date  . None     History reviewed. No pertinent family history.  History  Substance Use Topics  . Smoking status: Current Every Day Smoker -- 1.0 packs/day for 15 years    Types: Cigarettes  . Smokeless tobacco: Never Used  . Alcohol Use: No      Review of Systems  Constitutional: Negative for fever and chills.  Respiratory: Negative for shortness of breath.   Gastrointestinal: Negative for nausea and vomiting.  Musculoskeletal: Positive for arthralgias (right hand and right forearm pain).  Neurological: Negative for weakness.  All other systems reviewed and are negative.    Allergies  Benadryl; Nyquil; Tylenol; and Vistaril  Home Medications   No current outpatient prescriptions on file.  BP 134/78  Pulse 79  Temp 97.8 F (36.6 C) (Oral)  Resp 18  Ht 6\' 1"  (1.854 m)  Wt 181 lb (82.101 kg)  BMI 23.88 kg/m2  SpO2 99%  Physical Exam  Nursing note and vitals  reviewed. Constitutional: He is oriented to person, place, and time. He appears well-developed and well-nourished. No distress.  HENT:  Head: Normocephalic and atraumatic.  Eyes: EOM are normal.  Neck: Neck supple. No tracheal deviation present.  Cardiovascular: Normal rate.   Pulmonary/Chest: Effort normal. No respiratory distress.  Musculoskeletal: Normal range of motion.       No abnormal findings on physical exam.  Tender to palpation of his right 5 th metacarpal.  Normal cap refill.  Normal sensation to light touch.   Neurological: He is alert and oriented to person, place, and time.  Skin: Skin is warm and dry.  Psychiatric: He has a normal mood and affect. His behavior is normal.    ED Course  Procedures   DIAGNOSTIC STUDIES: Oxygen Saturation is 99% on room air, normal by my interpretation.    COORDINATION OF CARE: 9:08PM Ordered: DG Forearm Right  9:33 PM Pt offered motrin and tylenol and declined both.  Pt requested Toradol injection or narcotics and was denied by provider.  Pt understands and agrees.   9:35PM Ordered: Apply wrist splint   Labs Reviewed - No data to display Dg Forearm Right  07/17/2012  *RADIOLOGY REPORT*  Clinical Data: Pain; punched wall with forearm  RIGHT FOREARM - 2 VIEW  Comparison: None  Findings:  AP and lateral views of the right forearm demonstrate no acute fracture, or malalignment.  No focal soft tissue swelling. Osseous mineralization within normal limits.  IMPRESSION: Negative radiographs of the right forearm   Original Report Authenticated By: Malachy Moan, M.D.      1. Anxiety state, unspecified   2. Contusion of hand, right       MDM  Pt has been advised of the symptoms that warrant their return to the ED. Patient has voiced understanding and has agreed to follow-up with the PCP or specialist.  I personally performed the services described in this documentation, which was scribed in my presence. The recorded information has been  reviewed and is accurate.        Dorthula Matas, PA 07/18/12 0020

## 2012-07-17 NOTE — Progress Notes (Signed)
[facility] LCSW Aftercare Discharge Planning Group Note  07/17/2012 10:45 AM  Participation Quality:  Inattentive  Affect:  Blunted, Depressed and Flat  Cognitive:  Appropriate  Insight:  Lacking  Engagement in Group:  Lacking  Modes of Intervention:  Education, Exploration, Problem-solving and Support  Summary of Progress/Problems:  Patient shared he admitted to hospital due to having a lot of confusion and racing thoughts.  He stated he has no intent to kill himself but does not care if he were to die.  He advised of having home, transportation and follow up with [facility].  Patient shared he thought they were moving to slowly with medication and admitted to be stabilized.  He currently denies SI/HI.  Wynn Banker 07/17/2012, 10:45 AM

## 2012-07-17 NOTE — BHH Suicide Risk Assessment (Signed)
Suicide Risk Assessment  Admission Assessment     Nursing information obtained from:  Patient Demographic factors:  Male;Divorced or widowed;Caucasian;Low socioeconomic status;Unemployed Current Mental Status:  NA Loss Factors:  Financial problems / change in socioeconomic status;Decrease in vocational status Historical Factors:  Family history of mental illness or substance abuse Risk Reduction Factors:  Responsible for children under 34 years of age;Living with another person, especially a relative;Positive social support;Positive therapeutic relationship  CLINICAL FACTORS:   Severe Anxiety and/or Agitation Depression:   Anhedonia Hopelessness Insomnia Severe  COGNITIVE FEATURES THAT CONTRIBUTE TO RISK:  Thought constriction (tunnel vision)    SUICIDE RISK:   Mild:  Suicidal ideation of limited frequency, intensity, duration, and specificity.  There are no identifiable plans, no associated intent, mild dysphoria and related symptoms, good self-control (both objective and subjective assessment), few other risk factors, and identifiable protective factors, including available and accessible social support.  PLAN OF CARE: Adjust medications as needed. Plan for discharge once stable.  I certify that inpatient services furnished can reasonably be expected to improve the patient's condition.  Evely Gainey 07/17/2012, 1:13 PM
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
## 2012-07-17 NOTE — Progress Notes (Addendum)
Psychoeducational Group Note  Date:  07/17/2012 Time:  1000  Group Topic/Focus:  Therapeutic Activity  Participation Level: Did Not Attend  Participation Quality:  Not Applicable  Affect:  Not Applicable  Cognitive:  Not Applicable  Insight:  Not Applicable  Engagement in Group: Not Applicable  Additional Comments: Patient did not attend, RN stated patient was having audio hallucinations. Patient remained in bed. Karleen Hampshire Brittini 07/17/2012, 6:33 PM

## 2012-07-17 NOTE — Progress Notes (Signed)
Adult Psychoeducational Group Note  Date:  07/17/2012 Time:  2000  Group Topic/Focus:  Karaoke  Participation Level:  Did Not Attend  Participation Quality:  Did Not Attend  Affect:  Did Not Attend  Cognitive:  Did Not Attend  Insight:   Engagement in Group:  Did Not Attend  Modes of Intervention:    Additional Comments:  Pt did not attend because he was sent to the ED.  Humberto Seals Monique 07/17/2012, 10:32 PM
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
## 2012-07-17 NOTE — Tx Team (Signed)
Initial Interdisciplinary Treatment Plan  PATIENT STRENGTHS: (choose at least two) Communication skills General fund of knowledge Motivation for treatment/growth Supportive family/friends  PATIENT STRESSORS: Financial difficulties Health problems Medication change or noncompliance Substance abuse   PROBLEM LIST: Problem List/Patient Goals Date to be addressed Date deferred Reason deferred Estimated date of resolution  Depression 07/16/2012     Anxiety 07/16/2012       Hopelessness and Hleplesness 07/16/2012                                            DISCHARGE CRITERIA:  Ability to meet basic life and health needs Adequate post-discharge living arrangements Improved stabilization in mood, thinking, and/or behavior Verbal commitment to aftercare and medication compliance  PRELIMINARY DISCHARGE PLAN: Attend aftercare/continuing care group Outpatient therapy Participate in family therapy Return to previous work or school arrangements  PATIENT/FAMIILY INVOLVEMENT: This treatment plan has been presented to and reviewed with the patient, Casey Mercer.  The patient and family have been given the opportunity to ask questions and make suggestions.  [name] M 07/17/2012, 2:53 AM

## 2012-07-17 NOTE — Progress Notes (Signed)
D: Patient denies SI/HI and visual hallucinations. The patient states that he is positive for auditory hallucinations and states that he hears "whispering and loud sounds" on the unit. The patient has a depressed mood and affect. The patient rates his depression a 8 out of 10 and his hopelessness a 9 out of 10 (1 low/10 high). The patient reports sleeping fairly well and states that his appetite is poor and his energy level is low. The patient is intermittently attending groups on the unit and states that he wants to stay in his room where "the voices are softer."  A: Patient given emotional support from RN. Patient encouraged to come to staff with concerns and/or questions. Patient's medication routine continued. Patient's orders and plan of care reviewed.  R: Patient remains cooperative. Will continue to monitor patient q15 minutes for safety.

## 2012-07-17 NOTE — Progress Notes (Signed)
NSG Admisson Note: 32 year old male who presents voluntarily with depression, anxiety.   Patient denies SI/HI but states he hears mumbles but can make out what the voices are saying.  Patient verbally contracts for safety. Patient states he deals with mental illness and feels helpless and hopeless.  Patient states he is trying to get disability to provide for himself, his ex-wife and his two children.  Patient states he is currently living with his parents and his ex-wife had to move to [facility] because she could not afford to live in [place].  Patient also states he needs his medications changed.  Patient states he deals with racing thoughts.  Patient states he is concerned because he has not slept for five days.  Patient on the unit and patient went into his room.
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
## 2012-07-17 NOTE — Progress Notes (Signed)
Patient is calm and now agreeing to go to the ED for evaluation of his right arm injury. Patient apologized for his behavior. When asked why he was angry patient stated "his parents made him angry because he specifically asked them to bring certain things to him from his room and they did not bring the correct stuff." Patient admits that he is "OCD" and this really upset him. Patient also stated "his parents were going through his stuff in his room and throwing away things they thought was drug related or just anything they didn't think he needed. I also didn't want them to visit me because I knew it would upset me." Patient continued to apologize for his behavior and states he "is also frustrated because he can't go outside to smoke." Patient given nicotine gum as ordered. Patient remains calm.
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
## 2012-07-17 NOTE — Progress Notes (Signed)
Patient refusing to go to the ED for evaluation. Patient states "I don't need it." Patient requesting to be discharged, explained to patient he will need to sign the request for discharge form and that it could be up to 72 hours before he will be discharged, that it was the MD decision. Patient remains angry, requesting to speak with the person in charge. Charge nurse Boyd Kerbs, RN spoke with patient. Patient refuses to sign the request for discharge form, patient states "Im not signing this because it will mean I have to stay here 72 hours." Explained to patient that it is the MD decision and based on their decision he could be discharged prior to 72 hours. Patient continues to refuse sheet. Will continue to monitor patient.

## 2012-07-18 DIAGNOSIS — F3160 Bipolar disorder, current episode mixed, unspecified: Secondary | ICD-10-CM

## 2012-07-18 DIAGNOSIS — F1994 Other psychoactive substance use, unspecified with psychoactive substance-induced mood disorder: Secondary | ICD-10-CM

## 2012-07-18 MED ORDER — QUETIAPINE FUMARATE 100 MG PO TABS
100.0000 mg | ORAL_TABLET | Freq: Two times a day (BID) | ORAL | Status: DC
  Administered 2012-07-18 – 2012-07-19 (×2): 100 mg via ORAL
  Filled 2012-07-18 (×6): qty 1

## 2012-07-18 MED ORDER — DULOXETINE HCL 30 MG PO CPEP
30.0000 mg | ORAL_CAPSULE | Freq: Every day | ORAL | Status: DC
  Administered 2012-07-19 – 2012-07-21 (×3): 30 mg via ORAL
  Filled 2012-07-18 (×4): qty 1

## 2012-07-18 MED ORDER — ADULT MULTIVITAMIN W/MINERALS CH
1.0000 | ORAL_TABLET | Freq: Every day | ORAL | Status: DC
  Administered 2012-07-18 – 2012-07-21 (×4): 1 via ORAL
  Filled 2012-07-18 (×5): qty 1

## 2012-07-18 MED ORDER — MAGNESIUM CITRATE PO SOLN
1.0000 | Freq: Once | ORAL | Status: AC
  Administered 2012-07-18: 1 via ORAL

## 2012-07-18 NOTE — Progress Notes (Signed)
D: Patient denies SI/HI and visual hallucinations. Patient states that he is positive for auditory hallucinations and states that he is hearing "whispering voices" and that he "gets afraid" when they talk to him. The patient has a depressed mood and affect. The patient rates his depression and hopelessness both an 8 out of 10 (1 low/10 high). The patent reports not sleeping any last night and states that his appetite is improving and that he feels "hyper with energy." The patient is sporadically attending groups and states that he "needs to rest more." The patient can be irritable and reports feeling "off and not quite right."  A: Patient given emotional support from RN. Patient encouraged to come to staff with concerns and/or questions. Patient's medication routine continued. Patient's orders and plan of care reviewed.  R: Patient remains cooperative. Will continue to monitor patient q15 minutes for safety.

## 2012-07-18 NOTE — Progress Notes (Signed)
Pt sitting in hallway conversing with peer. Instructed by MHT to return to room as lights out was at 2300. Pt irritable and did not comply until this writer addressed his behavior again. Reports difficulty with sleep and encouraged pt to discuss with provider in AM. Pt agreeable. Lawrence Marseilles

## 2012-07-18 NOTE — Progress Notes (Signed)
[facility] LCSW Group Therapy  Relapse Prevention 1:15 - 2:30  07/18/2012 3:12 PM  Type of Therapy:  Group Therapy  Participation Level:  Active  Participation Quality:  Appropriate and Attentive  Affect:  Appropriate, Blunted, Depressed and Flat  Cognitive:  Alert and Appropriate  Insight:  Developing/Improving  Engagement in Therapy:  Engaged  Modes of Intervention:  Clarification, Discussion, Education, Exploration, Problem-solving, Rapport Building and Support  Summary of Progress/Problems: Patient advised he can prevent relapsing by being honest, living in the moment and put himself first.    Wynn Banker 07/18/2012, 3:12 PM

## 2012-07-18 NOTE — Progress Notes (Signed)
[facility] Group Notes:  (Nursing/MHT/Case Management/Adjunct)  Date:  07/18/2012  Time:  11:08 AM  Type of Therapy:  Therapuetic Activity   Participation Level:  Active  Participation Quality:  Appropriate and Attentive  Affect:  Appropriate  Cognitive:  Alert and Appropriate  Insight:  Appropriate  Engagement in Group:  Engaged  Modes of Intervention:  Activity  Summary of Progress/Problems:Pt. Participated in group activity of Human Bingo in which peers socialize and get to know each other. Afterwards Pt. Participated in group discussion.   Ruta Hinds [facility] 07/18/2012, 11:08 AM

## 2012-07-18 NOTE — Progress Notes (Signed)
[facility] LCSW Aftercare Discharge Planning Group Note  07/18/2012 10:36 AM  Participation Quality:  Appropriate and Attentive  Affect:  Appropriate and Irritable  Cognitive:  Alert and Appropriate  Insight:  Engaged  Engagement in Group:  Engaged  Modes of Intervention:  Education, Exploration, Problem-solving, Rapport Building and Support  Summary of Progress/Problems: Patient advised of not doing well today.  He denies SI/HI but endorses racing thoughts and A/H.  Patient advised of not sleeping well last night.  Wynn Banker 07/18/2012, 10:36 AM

## 2012-07-18 NOTE — Progress Notes (Signed)
Nutrition Brief Note  Patient identified on the Malnutrition Screening Tool (MST) Report  Body mass index is 23.88 kg/(m^2). Patient meets criteria for normal weight based on current BMI.   Pt reports consuming only 1 meal/day at night for the past week with 9 pound unintended weight loss in the past month. Pt reports he has had stomach pain for the last several days which has not been related to when pt has been eating. Pt reports he has not had a bowel movement in the past 2 days. Pt reports he is not interested in nutritional supplements. Pt reports he has been eating a little better today and states he ate some of his breakfast. Encouraged pt to continue to gradually increase his intake at mealtimes. Pt without any nutrition education needs.   Levon Hedger MS, RD, LDN [phone] Pager [phone] After Hours Pager

## 2012-07-18 NOTE — Progress Notes (Signed)
[facility] Group Notes:  (Nursing/MHT/Case Management/Adjunct)  Date:  07/18/2012  Time:  2000  Type of Therapy:  Psychoeducational Skills  Participation Level:  Minimal  Participation Quality:  Inattentive  Affect:  Flat  Cognitive:  Lacking  Insight:  Lacking  Engagement in Group:  Resistant  Modes of Intervention:  Education  Summary of Progress/Problems: The patient would only state that he slept poorly last evening and as a result had a bad day. He did not state a goal for tomorrow.   Hazle Coca S 07/18/2012, 10:28 PM

## 2012-07-18 NOTE — Tx Team (Signed)
Interdisciplinary Treatment Plan Update (Adult)  Date:  07/18/2012  Time Reviewed:  9:54 AM   Progress in Treatment: Attending groups:   Yes   Participating in groups:  Yes Taking medication as prescribed:  Yes Tolerating medication:  Yes Family/Significant othe contact made: Contact to be made with family Patient understands diagnosis:  Yes Discussing patient identified problems/goals with staff: Yes Medical problems stabilized or resolved: Yes Denies suicidal/homicidal ideation:No Issues/concerns per patient self-inventory:  Other:   New problem(s) identified:  Reason for Continuation of Hospitalization: Anxiety Depression Medication stabilization   Interventions implemented related to continuation of hospitalization:  Medication Management; safety checks q 15 mins  Additional comments:  Estimated length of stay: 3-4 days  Discharge Plan:  Home with outpatient follow up  New goal(s):  Review of initial/current patient goals per problem list:    1.  Goal(s): Eliminate SI/other thoughts of self harm (Patient will no longer endorse SI/HI or thoughts of self harm)   Met:  No  Target date: d/c  As evidenced by: Patient is not endorsing SI today    2.  Goal (s):Reduce anxiety (Patient will rate symptoms at four or below)  Met: No  Target date: d/c  As evidenced by: Patient rates anxiety at ten today    3.  Goal(s):.stabilize on meds (Patient will report being stabilized on medications)   Met:  No  Target date: d/c  As evidenced by: Patient continues to endorse sympotms    4.  Goal(s): Reduce or Eliminate Psychosis (Patient will no longer endorse A/H)   Met:  No  Target date: d/c  As evidenced by: Patient is endorsing A/H and states voices are more severe at night    Attendees: Patient:  Casey Mercer 07/18/2012 9:54 AM  Physican:  Patrick North, MD 07/18/2012 9:54 AM  Nursing:  Nestor Ramp 07/18/2012 9:54 AM   Nursing:   Berneice Heinrich, RN  07/18/2012 9:54 AM   Clinical Social Worker:  Juline Patch, LCSW 07/18/2012 9:54 AM   Other: Tera Helper, PHM-NP 07/18/2012 9:54 AM   Other:   07/18/2012 9:54 AM Other:        07/18/2012 9:54 AM
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
## 2012-07-19 ENCOUNTER — Encounter (HOSPITAL_COMMUNITY): Payer: Self-pay | Admitting: Behavioral Health

## 2012-07-19 DIAGNOSIS — F1994 Other psychoactive substance use, unspecified with psychoactive substance-induced mood disorder: Secondary | ICD-10-CM

## 2012-07-19 HISTORY — DX: Other psychoactive substance use, unspecified with psychoactive substance-induced mood disorder: F19.94

## 2012-07-19 MED ORDER — TRAZODONE HCL 50 MG PO TABS
50.0000 mg | ORAL_TABLET | Freq: Every day | ORAL | Status: DC
  Filled 2012-07-19 (×2): qty 1

## 2012-07-19 MED ORDER — ZIPRASIDONE HCL 40 MG PO CAPS
40.0000 mg | ORAL_CAPSULE | Freq: Two times a day (BID) | ORAL | Status: DC
  Administered 2012-07-19 – 2012-07-21 (×4): 40 mg via ORAL
  Filled 2012-07-19: qty 2
  Filled 2012-07-19 (×6): qty 1

## 2012-07-19 MED ORDER — TRAZODONE HCL 50 MG PO TABS
50.0000 mg | ORAL_TABLET | Freq: Every evening | ORAL | Status: DC | PRN
  Administered 2012-07-19 – 2012-07-21 (×2): 50 mg via ORAL
  Filled 2012-07-19: qty 1
  Filled 2012-07-19: qty 14
  Filled 2012-07-19 (×2): qty 1

## 2012-07-19 NOTE — Progress Notes (Signed)
Patient ID: Casey Mercer, male   DOB: 1980/12/31, 32 y.o.   MRN: [id number]  D: Patient pleasant on approach tonight. No irritability noted at this time. Does report mood better tonight but is a little concerned that the seroquel may make his "restless leg syndrome" worse. Says his legs were jumpy today. Patient hoping that he will be able to sleep tonight. No sleep medication ordered for him. No complaints of constipation tonight. No SI at present. A:Staff will continue to monitor on q 15 minute checks, follow treatment plan and give meds as ordered. R: Cooperative with staff at present. No complaints.
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
## 2012-07-19 NOTE — Progress Notes (Signed)
[facility] Group Notes:  (Nursing/MHT/Case Management/Adjunct)  Date:  07/19/2012  Time:  10:26 PM  Type of Therapy:  Psychoeducational Skills  Participation Level:  Active  Participation Quality:  Appropriate  Affect:  Excited  Cognitive:  Appropriate  Insight:  Improving  Engagement in Group:  Engaged  Modes of Intervention:  Education  Summary of Progress/Problems: The patient verbalized that he didn't have a very good day today. He states that his Seroquel has been ineffective thus far. He also states that his doctor has yet to prescribe the correct medication for him. On a positive note, he did verbalize that he was able to rest this afternoon. His goal for tomorrow is to have the correct medication prescribed for him.   Hazle Coca S 07/19/2012, 10:26 PM

## 2012-07-19 NOTE — Progress Notes (Signed)
D: Patient had c/o not sleeping last night and continues to be up tonight. Reported to tech on hall that it was frustrating. When physician extender called tonight, I asked for something to help him sleep. Trazodone order given. Offered this to patient but he refused saying he that he doesn't like the way sleeping pills make him feel.  A: Staff will monitor q15 minutes, follow treatment plan and give meds as ordered. R: Refused sleep medication at time but has a prn order if wants it in the future.

## 2012-07-19 NOTE — Progress Notes (Signed)
D:  Per pt self inventory pt reports no sleep, appetite good, energy level high, ability to pay attention good, rates depression at a 1 our of 10 and hopelessness at a 1 out of 10.  Denies SI/HI, endorses Auditory Hallucinations (mumbling).  Pt's biggest issue today is that he was not able to sleep at all last night.        A:  Emotional support provided, MD notified that pt unable to sleep, Encouraged pt to continue with treatment plan and attend all group activities, q15 min checks maintained for safety.  R:  Pt is cooperative and calm, pleasant with staff and peers, pt participates and attends groups.

## 2012-07-19 NOTE — Progress Notes (Signed)
Pt continues to complain of back pain which radiates down his legs. Motrin brought pain down to a 5/10. He also complains of sweating and anxiety though is observed in the dayroom playing cards with peers. Reports the first dose of geodon didn't help. Med education provided along with support, encouragement. Pt's mood remains irritable but he is generally cooperative. Denies SI/HI/VH but does endorse VH. Pt safe and presently in group. Lawrence Marseilles

## 2012-07-19 NOTE — Progress Notes (Signed)
Adult Psychoeducational Group Note  Date:  07/19/2012 Time:  0945am  Group Topic/Focus:  Coping With Mental Health Crisis:   The purpose of this group is to help patients identify strategies for coping with mental health crisis.  Group discusses possible causes of crisis and ways to manage them effectively. Self Inventory Review.  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  Pt showed a supportive attitudes towards others when they spoke in group.  Alfonse Spruce 07/19/2012, 3:58 PM

## 2012-07-19 NOTE — Clinical Social Work Psychosocial (Signed)
[facility] Group Notes: (Clinical Social Work)   07/19/2012      Type of Therapy:  Group Therapy   Participation Level:  Did Not Attend    Dnyla Antonetti Grossman-Orr, LCSW 07/19/2012, 4:14 PM     

## 2012-07-20 MED ORDER — BENZTROPINE MESYLATE 0.5 MG PO TABS
0.5000 mg | ORAL_TABLET | Freq: Two times a day (BID) | ORAL | Status: DC | PRN
  Administered 2012-07-20 – 2012-07-21 (×3): 0.5 mg via ORAL
  Filled 2012-07-20: qty 28
  Filled 2012-07-20 (×2): qty 1

## 2012-07-20 NOTE — Progress Notes (Signed)
Patient ID: Casey Mercer, male   DOB: 1981-04-06, 32 y.o.   MRN: [id number] [facility] MD Progress Note  07/20/2012 10:01 AM Casey Mercer  MRN:  [id number] Subjective:   Doesn't feel as well with the Geodon but his legs don't hurt. Assured him that the dose could be adjusted. 2:30pm says he feels side effects from the Geodon nurse asks for Cogentin and order given. Diagnosis:   Axis I: Bipolar, mixed and Substance Induced Mood Disorder Axis II: Deferred Axis III:  Past Medical History  Diagnosis Date  . Back pain   . Gastric ulcer, chronic   . Depression   . Anxiety   . Chronic pain   . Arthritis   . Bipolar 1 disorder   . Panic attacks   . Substance induced mood disorder 07/19/2012   Axis IV: housing problems, occupational problems and other psychosocial or environmental problems Axis V: 41-50 serious symptoms  ADL's:  Intact  Sleep: Fair  Appetite:  Fair   Psychiatric Specialty Exam: Review of Systems  Constitutional: Negative.   HENT: Positive for neck pain.   Eyes: Negative.   Respiratory: Negative.   Cardiovascular: Negative.   Gastrointestinal: Negative.   Genitourinary: Negative.   Musculoskeletal: Positive for back pain.  Skin: Negative.   Neurological: Negative.   Endo/Heme/Allergies: Negative.   Psychiatric/Behavioral: Positive for depression and suicidal ideas. The patient is nervous/anxious.        Racing thoughts     Blood pressure 127/81, pulse 81, temperature 98.3 F (36.8 C), temperature source Oral, resp. rate 16, height 6\' 1"  (1.854 m), weight 82.101 kg (181 lb), SpO2 99.00%.Body mass index is 23.88 kg/(m^2).  General Appearance: Casual  Eye Contact::  Fair  Speech:  Clear and Coherent  Volume:  Normal  Mood:  Anxious, Dysphoric and Hopeless  Affect:  Constricted and Depressed  Thought Process:  Coherent  Orientation:  Full (Time, Place, and Person)  Thought Content:  WDL  Suicidal Thoughts:  Yes.  without intent/plan  Homicidal Thoughts:  No   Memory:  Immediate;   Fair Recent;   Fair Remote;   Fair  Judgement:  Fair  Insight:  Present  Psychomotor Activity:  Normal  Concentration:  Fair  Recall:  Fair  Akathisia:  No  Handed:  Right  AIMS (if indicated):     Assets:  Communication Skills Desire for Improvement Social Support  Sleep:  Number of Hours: 4.25    Current Medications: Current Facility-Administered Medications  Medication Dose Route Frequency Provider Last Rate Last Dose  . alum & mag hydroxide-simeth (MAALOX/MYLANTA) 200-200-20 MG/5ML suspension 30 mL  30 mL Oral Q4H PRN Kerry Hough, PA      . DULoxetine (CYMBALTA) DR capsule 30 mg  30 mg Oral Daily Himabindu Ravi, MD   30 mg at 07/20/12 0756  . gabapentin (NEURONTIN) capsule 300 mg  300 mg Oral TID Kerry Hough, PA   300 mg at 07/20/12 0756  . ibuprofen (ADVIL,MOTRIN) tablet 600 mg  600 mg Oral Q6H PRN Kerry Hough, PA   600 mg at 07/20/12 0800  . magnesium hydroxide (MILK OF MAGNESIA) suspension 30 mL  30 mL Oral Daily PRN Kerry Hough, PA   30 mL at 07/18/12 0815  . multivitamin with minerals tablet 1 tablet  1 tablet Oral Daily Lavena Bullion, RD   1 tablet at 07/20/12 0756  . nicotine polacrilex (NICORETTE) gum 2 mg  2 mg Oral PRN Patrick North, MD  2 mg at 07/20/12 0757  . ondansetron (ZOFRAN-ODT) disintegrating tablet 4 mg  4 mg Oral Q6H PRN Kerry Hough, PA      . pantoprazole (PROTONIX) EC tablet 40 mg  40 mg Oral Daily Kerry Hough, PA   40 mg at 07/20/12 0800  . simvastatin (ZOCOR) tablet 20 mg  20 mg Oral QHS Kerry Hough, PA   20 mg at 07/19/12 2302  . traZODone (DESYREL) tablet 50 mg  50 mg Oral QHS PRN Sanjuana Kava, NP   50 mg at 07/19/12 2302  . ziprasidone (GEODON) capsule 40 mg  40 mg Oral BID WC Fredrik Cove, PA-C   40 mg at 07/20/12 1914    Lab Results: No results found for this or any previous visit (from the past 48 hour(s)).  Physical Findings: AIMS: Facial and Oral Movements Muscles of Facial  Expression: None, normal Lips and Perioral Area: None, normal Jaw: None, normal Tongue: None, normal,Extremity Movements Upper (arms, wrists, hands, fingers): None, normal Lower (legs, knees, ankles, toes): None, normal, Trunk Movements Neck, shoulders, hips: None, normal, Overall Severity Severity of abnormal movements (highest score from questions above): None, normal Incapacitation due to abnormal movements: None, normal Patient's awareness of abnormal movements (rate only patient's report): No Awareness, Dental Status Current problems with teeth and/or dentures?: Yes (grind teeth) Does patient usually wear dentures?: No  CIWA:    COWS:     Treatment Plan Summary: Daily contact with patient to assess and evaluate symptoms and progress in treatment Medication management  Plan: Stop Seroquel and start Geodon as above.  Discussed the rationale for adjusting the above medications with patient. Will consider switch to Olanzapine if patient has issues with geodon tomorrow.  Medical Decision Making Problem Points:  Established problem, worsening (2), Review of last therapy session (1) and Review of psycho-social stressors (1) Data Points:  Review of medication regiment & side effects (2) Review of new medications or change in dosage (2)  Reviewed note and discussed with the provider and agree with the above plan. I certify that inpatient services furnished can reasonably be expected to improve the patient's condition.   ADAMS,MICKIE D.RPA-C CAQ-Psych  07/20/2012, 10:01 AM

## 2012-07-20 NOTE — Progress Notes (Signed)
Pt has been up and has be en active while in the milieu today, has been attending and participating in various milieu activities. Pt has not endorsed any depression or suicidal thoughts, but has complained of anxiety and racing thoughts today. Pt has also eps type symptoms today and received PRN cogentin to help. Pt did say that the medication did ease the jitteriness that he was feeling due to the geodon. A. Support and encouragement provided. R. Safety maintained, will contihue to monitor.

## 2012-07-20 NOTE — Progress Notes (Signed)
Psychoeducational Group Note  Date:  07/20/2012 Time:  1000am  Group Topic/Focus:  Making Healthy Choices:   The focus of this group is to help patients identify negative/unhealthy choices they were using prior to admission and identify positive/healthier coping strategies to replace them upon discharge.  Participation Level:  Did Not Attend  Participation Quality:    Affect: Cognitive:   Insight:  Engagement in Group:  Additional Comments:  Inventory group   Valente David 07/20/2012,10:01 AM

## 2012-07-20 NOTE — Progress Notes (Signed)
Pt reports improvement on geodon and that the congentin was helpful earlier in the day. Still inquiring about zyprexa but willing to give geodon more time. Pt's mood less irritable than last night. Visible in milieu and interacting appropriately. Medicated per orders and given prn dose of cogentin at hs. He denies SI/HI/AVh and remains safe. Casey Mercer

## 2012-07-20 NOTE — Clinical Social Work Psychosocial (Signed)
[facility] Group Notes:  (Clinical Social Work)  07/20/2012   3:00-4:00PM  Summary of Progress/Problems:   Summary of Progress/Problems:   The main focus of today's process group was to define "support" and describe what healthy supports are.  We then discussed how and why to increase patient supports, using motivational interviewing.  An emphasis was placed on using counselor, doctor, therapy groups, self-help groups and problem-specific support groups to expand supports.   The patient expressed that he has no trouble asking for help from his supports.  He did not feel well, stated the medication was making his head feel unlike his normal self, so he ended up leaving group.  Type of Therapy:  Process Group  Participation Level:  Active  Participation Quality:  Attentive  Affect:  Blunted  Cognitive:  Appropriate  Insight:  Developing/Improving  Engagement in Therapy:  Developing/Improving  Modes of Intervention:  Education,  Support and Processing, Exploration, Discussion   Ambrose Mantle, LCSW 07/20/2012, 4:21 PM

## 2012-07-21 DIAGNOSIS — F3130 Bipolar disorder, current episode depressed, mild or moderate severity, unspecified: Principal | ICD-10-CM

## 2012-07-21 DIAGNOSIS — F1910 Other psychoactive substance abuse, uncomplicated: Secondary | ICD-10-CM

## 2012-07-21 MED ORDER — BENZTROPINE MESYLATE 0.5 MG PO TABS: 0.5000 mg | ORAL_TABLET | Freq: Two times a day (BID) | ORAL | Status: DC | PRN

## 2012-07-21 MED ORDER — ZIPRASIDONE HCL 40 MG PO CAPS: 40.0000 mg | ORAL_CAPSULE | Freq: Two times a day (BID) | ORAL | Status: DC

## 2012-07-21 MED ORDER — DULOXETINE HCL 30 MG PO CPEP: 30.0000 mg | ORAL_CAPSULE | Freq: Every day | ORAL | Status: DC

## 2012-07-21 MED ORDER — TRAZODONE HCL 50 MG PO TABS: 50.0000 mg | ORAL_TABLET | Freq: Every evening | ORAL | Status: DC | PRN

## 2012-07-21 MED ORDER — CLONIDINE HCL 0.1 MG PO TABS
0.1000 mg | ORAL_TABLET | Freq: Two times a day (BID) | ORAL | Status: DC
  Filled 2012-07-21 (×2): qty 1

## 2012-07-21 MED ORDER — SIMVASTATIN 20 MG PO TABS: 20.0000 mg | ORAL_TABLET | Freq: Every day | ORAL | Status: DC

## 2012-07-21 MED ORDER — PANTOPRAZOLE SODIUM 40 MG PO TBEC: 40.0000 mg | DELAYED_RELEASE_TABLET | Freq: Every day | ORAL | Status: DC

## 2012-07-21 MED ORDER — GABAPENTIN 300 MG PO CAPS: 300.0000 mg | ORAL_CAPSULE | Freq: Three times a day (TID) | ORAL | Status: DC

## 2012-07-21 MED ORDER — CLONIDINE HCL 0.1 MG PO TABS: 0.1000 mg | ORAL_TABLET | Freq: Two times a day (BID) | ORAL | Status: DC

## 2012-07-21 NOTE — Progress Notes (Addendum)
[facility] Adult Case Management Discharge Plan :  Will you be returning to the same living situation after discharge: Yes,  Patient to return home with family At discharge, do you have transportation home?:Yes,  Patient to arrange for family to transport him home Do you have the ability to pay for your medications:No.  Patient assisted with indigent medications.  Release of information consent forms completed and in the chart;  Patient's signature needed at discharge.  Patient to Follow up at: Follow-up Information    Follow up with Cammy Copa, MD. (As needed if symptoms worsen)    Contact information:   [address] [place] [address] [phone]        Dr. Harmon Dun - [facility] Thursday, July 24, 2012 at 3:30 PM [address] [place], [place]   [address]    [phone]  Neva Seat - Family [facility] Friday, July 25, 2012 at 2:00 PM  Patient denies SI/HI:   Yes,  Patient has not endorsed SI/HI during hospitalization or on admission    Safety Planning and Suicide Prevention discussed:  Yes,  Reviewed with all patients attending aftercare group  Wynn Banker 07/21/2012, 12:37 PM

## 2012-07-21 NOTE — Tx Team (Signed)
Interdisciplinary Treatment Plan Update (Adult)  Date:  07/21/2012  Time Reviewed:  10:14 AM   Progress in Treatment: Attending groups:   Yes   Participating in groups:  Yes Taking medication as prescribed:  Yes Tolerating medication:  Yes Family/Significant othe contact made: Contact made with family Patient understands diagnosis:  Yes Discussing patient identified problems/goals with staff: Yes Medical problems stabilized or resolved: Yes Denies suicidal/homicidal ideation:Yes Issues/concerns per patient self-inventory:  Other:   New problem(s) identified:  Reason for Continuation of Hospitalization:  Interventions implemented related to continuation of hospitalization:  Additional comments:  Estimated length of stay:  Discharge today  Discharge Plan:  Home with outpatient follow up at [facility] goal(s):  Review of initial/current patient goals per problem list:    1.  Goal(s): Eliminate SI/other thoughts of self harm (Patient will no longer endorse SI/HI or thoughts of self harm)   Met:  Yes  Target date: d/c  As evidenced by: Patient no longer endorses SI/HI or other thoughts of self harm.    2.  Goal (s):Reduce depression/anxiety (Paitent will rate symptoms at four or below)  Met: Yes  Target date: d/c  As evidenced by: Patient rates symptoms at zero today    3.  Goal(s):.stabilize on meds (Patient will report being stabilized on medications)   Met:  Yes  Target date: d/c  As evidenced by: Patient reports being stabilized on medications and ready for discharge  4.  Goal(s): Refer for outpatient follow up (Follow up appointment will be scheduled)   Met:  Yes  Target date: d/c  As evidenced by: Follow up appointment scheduled    Attendees: Patient:   07/21/2012 10:14 AM  Physican:  Patrick North, MD 07/21/2012 10:14 AM  Nursing:  Neill Loft, RN 07/21/2012 10:14 AM   Nursing:   Quintella Reichert, RN 07/21/2012 10:14 AM   Clinical  Social Worker:  Juline Patch, LCSW 07/21/2012 10:14 AM   Other: Tera Helper, PHM-NP 07/21/2012 10:14 AM   Other:         07/21/2012 10:14 AM Other:        07/21/2012 10:14 AM

## 2012-07-21 NOTE — Progress Notes (Signed)
[facility] INPATIENT:  Family/Significant Other Suicide Prevention Education  Suicide Prevention Education:  Patient Refusal for Family/Significant Other Suicide Prevention Education: The patient Casey Mercer has refused to provide written consent for family/significant other to be provided Family/Significant Other Suicide Prevention Education during admission and/or prior to discharge.  Physician notified.    Wynn Banker 07/21/2012, 12:44 PM

## 2012-07-21 NOTE — Progress Notes (Signed)
[facility] LCSW Aftercare Discharge Planning Group Note  07/21/2012 12:49 PM  Participation Quality:  Appropriate and Attentive  Affect:  Appropriate and Irritable  Cognitive:  Alert and Appropriate  Insight:  Engaged  Engagement in Group:  Engaged  Modes of Intervention:  Education, Exploration, Problem-solving, Rapport Building and Support  Summary of Progress/Problems: Patient reports doing well today and looks forward to discharging home.  He denies SI/HI and rates symptoms at one.  Patient will follow up with [facility] for outpatient services.  He will need assistance with indigent medications.    Wynn Banker 07/21/2012, 12:49 PM

## 2012-07-21 NOTE — Discharge Summary (Signed)
Physician Discharge Summary Note  Patient:  Casey Mercer is an 32 y.o., male MRN:  [id number] DOB:  09/28/1980 Patient phone:  [phone] (home)  Patient address:   [address] [place] [address],   Date of Admission:  07/16/2012 Date of Discharge: 07/21/2012  Reason for Admission:  Psychotic symptoms  Discharge Diagnoses: Principal Problem:  *Substance induced mood disorder  Review of Systems  Constitutional: Negative.   HENT: Negative.   Eyes: Negative.   Respiratory: Negative.   Cardiovascular: Negative.   Gastrointestinal: Negative.   Genitourinary: Negative.   Musculoskeletal: Negative.        Shoulder pain  Skin: Negative.   Neurological: Negative.   Endo/Heme/Allergies: Negative.   Psychiatric/Behavioral: Positive for substance abuse. The patient is nervous/anxious.    Axis Diagnosis:   AXIS I:  Anxiety Disorder NOS, Bipolar, Depressed, Substance Abuse and Substance Induced Mood Disorder AXIS II:  Deferred AXIS III:   Past Medical History  Diagnosis Date  . Back pain   . Gastric ulcer, chronic   . Depression   . Anxiety   . Chronic pain   . Arthritis   . Bipolar 1 disorder   . Panic attacks   . Substance induced mood disorder 07/19/2012   AXIS IV:  other psychosocial or environmental problems, problems related to social environment and problems with primary support group AXIS V:  61-70 mild symptoms  Level of Care:  OP  Hospital Course:  Reviewed chart, vital signs, medications, and notes. 1-Individual and group therapy attended 2-Medication managed for depression and anxiety:  Medications reviewed with the patient and he stated no adverse effects, clonidine added for hypertension and anxiety 3-Coping skills for depression and anxiety developed and utilized 4-Crisis stabilization and management completed 5-Addressed health issues--stable 6-Treatment plan in place to prevent relapse of depression and anxiety 7-Patient denied suicidal/homicidal  ideations and auditory/visual hallucinations, follow-up appointments encouraged to attend, Rx and 2 week supply of medications given at discharge, stable will follow-up with [facility] for support after discharge  Consults:  None  Significant Diagnostic Studies:  labs: Completed and reviewed, stable  Discharge Vitals:   Blood pressure 143/93, pulse 92, temperature 97.6 F (36.4 C), temperature source Oral, resp. rate 18, height 6\' 1"  (1.854 m), weight 82.101 kg (181 lb), SpO2 99.00%. Body mass index is 23.88 kg/(m^2). Lab Results:   No results found for this or any previous visit (from the past 72 hour(s)).  Physical Findings: AIMS: Facial and Oral Movements Muscles of Facial Expression: None, normal Lips and Perioral Area: None, normal Jaw: None, normal Tongue: None, normal,Extremity Movements Upper (arms, wrists, hands, fingers): None, normal Lower (legs, knees, ankles, toes): None, normal, Trunk Movements Neck, shoulders, hips: None, normal, Overall Severity Severity of abnormal movements (highest score from questions above): None, normal Incapacitation due to abnormal movements: None, normal Patient's awareness of abnormal movements (rate only patient's report): No Awareness, Dental Status Current problems with teeth and/or dentures?: Yes (grind teeth) Does patient usually wear dentures?: No  CIWA:    COWS:     Psychiatric Specialty Exam: See Psychiatric Specialty Exam and Suicide Risk Assessment completed by Attending Physician prior to discharge.  Discharge destination:  Home  Is patient on multiple antipsychotic therapies at discharge:  No   Has Patient had three or more failed trials of antipsychotic monotherapy by history:  No Recommended Plan for Multiple Antipsychotic Therapies:  N/A  Discharge Orders    Future Orders Please Complete By Expires   Diet - low  sodium heart healthy      Activity as tolerated - No restrictions          Medication List     As  of 07/21/2012 11:48 AM    TAKE these medications      Indication    benztropine 0.5 MG tablet   Commonly known as: COGENTIN   Take 1 tablet (0.5 mg total) by mouth 2 (two) times daily as needed.    Indication: Extrapyramidal Reaction caused by Medications      cloNIDine 0.1 MG tablet   Commonly known as: CATAPRES   Take 1 tablet (0.1 mg total) by mouth 2 (two) times daily.    Indication: High Blood Pressure, anxiety      DULoxetine 30 MG capsule   Commonly known as: CYMBALTA   Take 1 capsule (30 mg total) by mouth daily.    Indication: Major Depressive Disorder      gabapentin 300 MG capsule   Commonly known as: NEURONTIN   Take 1 capsule (300 mg total) by mouth 3 (three) times daily.    Indication: Neuropathic Pain      pantoprazole 40 MG tablet   Commonly known as: PROTONIX   Take 1 tablet (40 mg total) by mouth daily.    Indication: Gastroesophageal Reflux Disease      simvastatin 20 MG tablet   Commonly known as: ZOCOR   Take 1 tablet (20 mg total) by mouth at bedtime.    Indication: hyperlipidemia      traZODone 50 MG tablet   Commonly known as: DESYREL   Take 1 tablet (50 mg total) by mouth at bedtime as needed for sleep.    Indication: Trouble Sleeping      ziprasidone 40 MG capsule   Commonly known as: GEODON   Take 1 capsule (40 mg total) by mouth 2 (two) times daily with a meal.    Indication: Manic-Depression           Follow-up Information    Follow up with Cammy Copa, MD. (As needed if symptoms worsen)    Contact information:   [address] [place] [address] [phone]          Follow-up recommendations:  Activity as tolerated, low sodium heart healthy diet  Comments:  Patient will follow-up with Family Services and his regular MD.  Total Discharge Time:  Greater than 30 minutes  Signed: Nanine Means, PMH-NP 07/21/2012, 11:48 AM

## 2012-07-21 NOTE — ED Provider Notes (Signed)
Medical screening examination/treatment/procedure(s) were performed by non-physician practitioner and as supervising physician I was immediately available for consultation/collaboration.  Ebbie Sorenson, MD 07/21/12 1444 

## 2012-07-21 NOTE — Progress Notes (Signed)
Adult Psychoeducational Group Note  Date:  07/21/2012 Time:  12:20 PM  Group Topic/Focus:  Wellness Toolbox:   The focus of this group is to discuss various aspects of wellness, balancing those aspects and exploring ways to increase the ability to experience wellness.  Patients will create a wellness toolbox for use upon discharge.  Participation Level:  Active  Participation Quality:  Appropriate and Attentive  Affect:  Blunted  Cognitive:  Oriented  Insight: Good  Engagement in Group:  Engaged but did not speak in group. He did fill out his workbook sheet.  Modes of Intervention:  Discussion, Education and Support  Additional Comments:  Pt stated his goal is to see his kids.   Corisa Montini T 07/21/2012, 12:20 PM

## 2012-07-21 NOTE — Progress Notes (Signed)
Patient ID: Casey Mercer, male   DOB: [birth year], 32 y.o.   MRN: [id number] Patient was discharged ambulatory to ride home with his mother.  He denies SI/Hi.  He verbalizes understanding of his discharge meds and followup.  He was given supply of meds and scripts by MD.  He hopeful that his sleep will improve at home in his own bed.

## 2012-07-21 NOTE — Discharge Summary (Signed)
Reviewed

## 2012-07-21 NOTE — [facility] Suicide Risk Assessment (Signed)
Suicide Risk Assessment  Discharge Assessment     Demographic Factors:  Male, Caucasian, Low socioeconomic status and Unemployed  Mental Status Per Nursing Assessment::   On Admission:  NA  Current Mental Status by Physician: Patient alert and oriented to 4. Denies AH/VH/SI/HI.  Loss Factors: Decrease in vocational status and Financial problems/change in socioeconomic status  Historical Factors: Impulsivity  Risk Reduction Factors:   Positive social support  Continued Clinical Symptoms:  Depression:   Recent sense of peace/wellbeing  Cognitive Features That Contribute To Risk:  Cognitively intact  Suicide Risk:  Minimal: No identifiable suicidal ideation.  Patients presenting with no risk factors but with morbid ruminations; may be classified as minimal risk based on the severity of the depressive symptoms  Discharge Diagnoses:   AXIS I:  Depressive Disorder NOS AXIS II:  Deferred AXIS III:   Past Medical History  Diagnosis Date  . Back pain   . Gastric ulcer, chronic   . Depression   . Anxiety   . Chronic pain   . Arthritis   . Bipolar 1 disorder   . Panic attacks   . Substance induced mood disorder 07/19/2012   AXIS IV:  economic problems, occupational problems and other psychosocial or environmental problems AXIS V:  61-70 mild symptoms  Plan Of Care/Follow-up recommendations:  Activity:  Normal Diet:  Normal  Is patient on multiple antipsychotic therapies at discharge:  No   Has Patient had three or more failed trials of antipsychotic monotherapy by history:  No  Recommended Plan for Multiple Antipsychotic Therapies: NA  Aerion Bagdasarian 07/21/2012, 1:16 PM

## 2012-07-22 NOTE — Progress Notes (Signed)
Patient Discharge Instructions:  After Visit Summary (AVS):   Faxed to:  07/22/12 Discharge Summary Note:   Faxed to:  07/22/12 Psychiatric Admission Assessment Note:   Faxed to:  07/22/12 Suicide Risk Assessment - Discharge Assessment:   Faxed to:  07/22/12 Faxed/Sent to the Next Level Care provider:  07/22/12 Faxed to [facility] of the [facility] @ [phone]  Jerelene Redden, 07/22/2012, 3:02 PM

## 2013-06-17 ENCOUNTER — Emergency Department (HOSPITAL_BASED_OUTPATIENT_CLINIC_OR_DEPARTMENT_OTHER)
Admission: EM | Admit: 2013-06-17 | Discharge: 2013-06-17 | Disposition: A | Discharge status: Home / Self Care | Payer: Self-pay | Attending: Emergency Medicine | Admitting: Emergency Medicine

## 2013-06-17 ENCOUNTER — Encounter (HOSPITAL_BASED_OUTPATIENT_CLINIC_OR_DEPARTMENT_OTHER): Payer: Self-pay | Admitting: Emergency Medicine

## 2013-06-17 DIAGNOSIS — Z79899 Other long term (current) drug therapy: Secondary | ICD-10-CM | POA: Insufficient documentation

## 2013-06-17 DIAGNOSIS — G8929 Other chronic pain: Secondary | ICD-10-CM | POA: Insufficient documentation

## 2013-06-17 DIAGNOSIS — F17200 Nicotine dependence, unspecified, uncomplicated: Secondary | ICD-10-CM | POA: Insufficient documentation

## 2013-06-17 DIAGNOSIS — M129 Arthropathy, unspecified: Secondary | ICD-10-CM | POA: Insufficient documentation

## 2013-06-17 DIAGNOSIS — Z8719 Personal history of other diseases of the digestive system: Secondary | ICD-10-CM | POA: Insufficient documentation

## 2013-06-17 DIAGNOSIS — F3130 Bipolar disorder, current episode depressed, mild or moderate severity, unspecified: Secondary | ICD-10-CM | POA: Insufficient documentation

## 2013-06-17 DIAGNOSIS — H109 Unspecified conjunctivitis: Secondary | ICD-10-CM | POA: Insufficient documentation

## 2013-06-17 DIAGNOSIS — F411 Generalized anxiety disorder: Secondary | ICD-10-CM | POA: Insufficient documentation

## 2013-06-17 MED ORDER — FLUORESCEIN SODIUM 1 MG OP STRP
ORAL_STRIP | OPHTHALMIC | Status: AC
  Administered 2013-06-17: 15:00:00
  Filled 2013-06-17: qty 1

## 2013-06-17 MED ORDER — TOBRAMYCIN 0.3 % OP SOLN: 2.0000 [drp] | OPHTHALMIC | Status: DC

## 2013-06-17 MED ORDER — HYDROCODONE-IBUPROFEN 7.5-200 MG PO TABS: 1.0000 | ORAL_TABLET | Freq: Four times a day (QID) | ORAL | Status: DC | PRN

## 2013-06-17 MED ORDER — TETRACAINE HCL 0.5 % OP SOLN
OPHTHALMIC | Status: AC
  Administered 2013-06-17: 15:00:00
  Filled 2013-06-17: qty 2

## 2013-06-17 MED ORDER — CEPHALEXIN 500 MG PO CAPS: 500.0000 mg | ORAL_CAPSULE | Freq: Four times a day (QID) | ORAL | Status: DC

## 2013-06-17 NOTE — ED Provider Notes (Signed)
Medical screening examination/treatment/procedure(s) were performed by non-physician practitioner and as supervising physician I was immediately available for consultation/collaboration.  EKG Interpretation   None         Megan E Docherty, MD 06/17/13 1538 

## 2013-06-17 NOTE — ED Notes (Signed)
Pt c/o right eye redness and pain with drainage x 4 days

## 2013-06-17 NOTE — ED Provider Notes (Signed)
[name]: [id number]     Arrival date & time 06/17/13  1303 History   First MD Initiated Contact with Patient 06/17/13 1424     Chief Complaint  Patient presents with  . Eye Problem   (Consider location/radiation/quality/duration/timing/severity/associated sxs/prior Treatment) Patient is a 32 y.o. male presenting with conjunctivitis. The history is provided by the patient. No language interpreter was used.  Conjunctivitis This is a new problem. Episode onset: 4 days. The problem occurs constantly. The problem has been gradually worsening. Nothing aggravates the symptoms. He has tried nothing for the symptoms. The treatment provided no relief.  Pt complains of drainage from right eye.   Pt reports he felt like he had grit in his eye on sat.  No known injury  Past Medical History  Diagnosis Date  . Back pain   . Gastric ulcer, chronic   . Depression   . Anxiety   . Chronic pain   . Arthritis   . Bipolar 1 disorder   . Panic attacks   . Substance induced mood disorder 07/19/2012   Past Surgical History  Procedure Laterality Date  . None     History reviewed. No pertinent family history. History  Substance Use Topics  . Smoking status: Current Every Day Smoker -- 1.00 packs/day for 15 years    Types: Cigarettes  . Smokeless tobacco: Never Used  . Alcohol Use: No    Review of Systems  Eyes: Positive for discharge, redness and itching.  All other systems reviewed and are negative.    Allergies  Benadryl; Nyquil; Tylenol; and Vistaril  Home Medications   Current Outpatient Rx  Name  Route  Sig  Dispense  Refill  . benztropine (COGENTIN) 0.5 MG tablet   Oral   Take 1 tablet (0.5 mg total) by mouth 2 (two) times daily as needed.   30 tablet   0   . cloNIDine (CATAPRES) 0.1 MG tablet   Oral   Take 1 tablet (0.1 mg total) by mouth 2 (two) times daily.   60 tablet   0   . DULoxetine (CYMBALTA) 30 MG capsule   Oral   Take 1 capsule (30 mg total) by mouth daily.   30  capsule   0   . gabapentin (NEURONTIN) 300 MG capsule   Oral   Take 1 capsule (300 mg total) by mouth 3 (three) times daily.   90 capsule   0   . pantoprazole (PROTONIX) 40 MG tablet   Oral   Take 1 tablet (40 mg total) by mouth daily.   30 tablet   0   . simvastatin (ZOCOR) 20 MG tablet   Oral   Take 1 tablet (20 mg total) by mouth at bedtime.   30 tablet   0   . traZODone (DESYREL) 50 MG tablet   Oral   Take 1 tablet (50 mg total) by mouth at bedtime as needed for sleep.   30 tablet   0   . ziprasidone (GEODON) 40 MG capsule   Oral   Take 1 capsule (40 mg total) by mouth 2 (two) times daily with a meal.   60 capsule   0    BP 137/79  Pulse 73  Temp(Src) 97.9 F (36.6 C) (Oral)  Resp 16  Ht 6' (1.829 m)  Wt 200 lb (90.719 kg)  BMI 27.12 kg/m2  SpO2 99% Physical Exam  Nursing note and vitals reviewed. Constitutional: He appears well-developed and well-nourished.  HENT:  Head: Normocephalic and  atraumatic.  Eyes: EOM are normal. Right eye exhibits discharge.  Injected,  Drainage.  Swelling around eye.   Neck: Normal range of motion. Neck supple.  Cardiovascular: Normal rate.   Pulmonary/Chest: Effort normal.  Musculoskeletal: Normal range of motion.  Neurological: He is alert.  Skin: Skin is warm.    ED Course  Procedures (including critical care time) Labs Review Labs Reviewed - No data to display Imaging Review No results found.  EKG Interpretation   None       MDM   1. Conjunctivitis    Tobrx opth, vicoprofen and keflex    Elson Areas, [place] 06/17/13 1459

## 2013-11-12 ENCOUNTER — Encounter (HOSPITAL_BASED_OUTPATIENT_CLINIC_OR_DEPARTMENT_OTHER): Payer: Self-pay | Admitting: Emergency Medicine

## 2013-11-12 ENCOUNTER — Emergency Department (HOSPITAL_BASED_OUTPATIENT_CLINIC_OR_DEPARTMENT_OTHER)
Admission: EM | Admit: 2013-11-12 | Discharge: 2013-11-12 | Disposition: A | Discharge status: Home / Self Care | Payer: Self-pay | Attending: Emergency Medicine | Admitting: Emergency Medicine

## 2013-11-12 DIAGNOSIS — F411 Generalized anxiety disorder: Secondary | ICD-10-CM | POA: Insufficient documentation

## 2013-11-12 DIAGNOSIS — Y9289 Other specified places as the place of occurrence of the external cause: Secondary | ICD-10-CM | POA: Insufficient documentation

## 2013-11-12 DIAGNOSIS — Z792 Long term (current) use of antibiotics: Secondary | ICD-10-CM | POA: Insufficient documentation

## 2013-11-12 DIAGNOSIS — Z8739 Personal history of other diseases of the musculoskeletal system and connective tissue: Secondary | ICD-10-CM | POA: Insufficient documentation

## 2013-11-12 DIAGNOSIS — K257 Chronic gastric ulcer without hemorrhage or perforation: Secondary | ICD-10-CM | POA: Insufficient documentation

## 2013-11-12 DIAGNOSIS — S39012A Strain of muscle, fascia and tendon of lower back, initial encounter: Secondary | ICD-10-CM

## 2013-11-12 DIAGNOSIS — S335XXA Sprain of ligaments of lumbar spine, initial encounter: Secondary | ICD-10-CM | POA: Insufficient documentation

## 2013-11-12 DIAGNOSIS — Z79899 Other long term (current) drug therapy: Secondary | ICD-10-CM | POA: Insufficient documentation

## 2013-11-12 DIAGNOSIS — Y9371 Activity, boxing: Secondary | ICD-10-CM | POA: Insufficient documentation

## 2013-11-12 DIAGNOSIS — X58XXXA Exposure to other specified factors, initial encounter: Secondary | ICD-10-CM | POA: Insufficient documentation

## 2013-11-12 DIAGNOSIS — G8929 Other chronic pain: Secondary | ICD-10-CM | POA: Insufficient documentation

## 2013-11-12 DIAGNOSIS — F319 Bipolar disorder, unspecified: Secondary | ICD-10-CM | POA: Insufficient documentation

## 2013-11-12 DIAGNOSIS — Z87891 Personal history of nicotine dependence: Secondary | ICD-10-CM | POA: Insufficient documentation

## 2013-11-12 MED ORDER — KETOROLAC TROMETHAMINE 60 MG/2ML IM SOLN
60.0000 mg | Freq: Once | INTRAMUSCULAR | Status: AC
  Administered 2013-11-12: 60 mg via INTRAMUSCULAR
  Filled 2013-11-12: qty 2

## 2013-11-12 MED ORDER — CYCLOBENZAPRINE HCL 10 MG PO TABS: 10.0000 mg | ORAL_TABLET | Freq: Two times a day (BID) | ORAL | Status: DC | PRN

## 2013-11-12 MED ORDER — CYCLOBENZAPRINE HCL 10 MG PO TABS
5.0000 mg | ORAL_TABLET | Freq: Once | ORAL | Status: AC
  Administered 2013-11-12: 5 mg via ORAL
  Filled 2013-11-12: qty 1

## 2013-11-12 NOTE — ED Notes (Signed)
Low back pain since kick-boxing 3 days ago.

## 2013-11-12 NOTE — ED Provider Notes (Signed)
[name]: [id number]     Arrival date & time 11/12/13  1603 History   First MD Initiated Contact with Patient 11/12/13 1618     Chief Complaint  Patient presents with  . Back Pain     (Consider location/radiation/quality/duration/timing/severity/associated sxs/prior Treatment) Patient is a 33 y.o. male presenting with back pain. The history is provided by the patient. No language interpreter was used.  Back Pain Location:  Lumbar spine Quality:  Aching Radiates to:  Does not radiate Pain severity:  Moderate Pain is:  Same all the time Onset quality:  Gradual Duration:  3 days Timing:  Constant Progression:  Worsening Chronicity:  New Context: physical stress   Context comment:  New kickboxing class Relieved by:  Nothing Worsened by:  Standing and movement Ineffective treatments:  OTC medications Associated symptoms: no abdominal pain, no abdominal swelling, no bladder incontinence, no bowel incontinence, no chest pain, no dysuria, no fever, no headaches, no numbness and no weakness   Risk factors comment:  Hx of back pain   Past Medical History  Diagnosis Date  . Back pain   . Gastric ulcer, chronic   . Depression   . Anxiety   . Chronic pain   . Arthritis   . Bipolar 1 disorder   . Panic attacks   . Substance induced mood disorder 07/19/2012   Past Surgical History  Procedure Laterality Date  . None     No family history on file. History  Substance Use Topics  . Smoking status: Former Smoker -- 1.00 packs/day for 15 years    Types: Cigarettes    Quit date: 11/08/2013  . Smokeless tobacco: Never Used  . Alcohol Use: No    Review of Systems  Constitutional: Negative for fever, activity change, appetite change and fatigue.  HENT: Negative for congestion, facial swelling, rhinorrhea and trouble swallowing.   Eyes: Negative for photophobia and pain.  Respiratory: Negative for cough, chest tightness and shortness of breath.   Cardiovascular: Negative for chest pain  and leg swelling.  Gastrointestinal: Negative for nausea, vomiting, abdominal pain, diarrhea, constipation and bowel incontinence.  Endocrine: Negative for polydipsia and polyuria.  Genitourinary: Negative for bladder incontinence, dysuria, urgency, decreased urine volume and difficulty urinating.  Musculoskeletal: Positive for back pain. Negative for gait problem.  Skin: Negative for color change, rash and wound.  Allergic/Immunologic: Negative for immunocompromised state.  Neurological: Negative for dizziness, facial asymmetry, speech difficulty, weakness, numbness and headaches.  Psychiatric/Behavioral: Negative for confusion, decreased concentration and agitation.      Allergies  Benadryl; Nyquil; Tylenol; and Vistaril  Home Medications   Prior to Admission medications   Medication Sig Start Date End Date Taking? Authorizing Provider  benztropine (COGENTIN) 0.5 MG tablet Take 1 tablet (0.5 mg total) by mouth 2 (two) times daily as needed. 07/21/12   Nanine MeansJamison Lord, NP  cephALEXin (KEFLEX) 500 MG capsule Take 1 capsule (500 mg total) by mouth 4 (four) times daily. 06/17/13   Elson AreasLeslie K Sofia, PA-C  cloNIDine (CATAPRES) 0.1 MG tablet Take 1 tablet (0.1 mg total) by mouth 2 (two) times daily. 07/21/12   Nanine MeansJamison Lord, NP  cyclobenzaprine (FLEXERIL) 10 MG tablet Take 1 tablet (10 mg total) by mouth 2 (two) times daily as needed for muscle spasms. 11/12/13   Shanna CiscoMegan E Temprence Rhines, MD  DULoxetine (CYMBALTA) 30 MG capsule Take 1 capsule (30 mg total) by mouth daily. 07/21/12   Nanine MeansJamison Lord, NP  gabapentin (NEURONTIN) 300 MG capsule Take 1 capsule (300 mg total) by  mouth 3 (three) times daily. 07/21/12   Nanine Means, NP  HYDROcodone-ibuprofen (VICOPROFEN) 7.5-200 MG per tablet Take 1 tablet by mouth every 6 (six) hours as needed for moderate pain. 06/17/13   Elson Areas, PA-C  pantoprazole (PROTONIX) 40 MG tablet Take 1 tablet (40 mg total) by mouth daily. 07/21/12   Nanine Means, NP  simvastatin (ZOCOR) 20  MG tablet Take 1 tablet (20 mg total) by mouth at bedtime. 07/21/12   Nanine Means, NP  tobramycin (TOBREX) 0.3 % ophthalmic solution Place 2 drops into the right eye every 4 (four) hours. 06/17/13   Elson Areas, PA-C  traZODone (DESYREL) 50 MG tablet Take 1 tablet (50 mg total) by mouth at bedtime as needed for sleep. 07/21/12   Nanine Means, NP  ziprasidone (GEODON) 40 MG capsule Take 1 capsule (40 mg total) by mouth 2 (two) times daily with a meal. 07/21/12   Nanine Means, NP   BP 128/63  Pulse 82  Temp(Src) 97.6 F (36.4 C) (Oral)  Resp 16  Ht 6' (1.829 m)  Wt 178 lb (80.74 kg)  BMI 24.14 kg/m2  SpO2 100% Physical Exam  Constitutional: He is oriented to person, place, and time. He appears well-developed and well-nourished. No distress.  HENT:  Head: Normocephalic and atraumatic.  Mouth/Throat: No oropharyngeal exudate.  Eyes: Pupils are equal, round, and reactive to light.  Neck: Normal range of motion. Neck supple.  Cardiovascular: Normal rate, regular rhythm and normal heart sounds.  Exam reveals no gallop and no friction rub.   No murmur heard. Pulmonary/Chest: Effort normal and breath sounds normal. No respiratory distress. He has no wheezes. He has no rales.  Abdominal: Soft. Bowel sounds are normal. He exhibits no distension and no mass. There is no tenderness. There is no rebound and no guarding.  Musculoskeletal: Normal range of motion. He exhibits no edema.       Lumbar back: He exhibits tenderness. He exhibits no bony tenderness.       Back:  Neurological: He is alert and oriented to person, place, and time. He has normal strength. No cranial nerve deficit or sensory deficit. He exhibits normal muscle tone. Gait normal.  Skin: Skin is warm and dry.  Psychiatric: He has a normal mood and affect.    ED Course  Procedures (including critical care time) Labs Review Labs Reviewed - No data to display  Imaging Review No results found.   EKG Interpretation None       MDM   Final diagnoses:  Low back strain    Pt is a 33 y.o. male with Pmhx as above who presents with 3 days of low back pain/spasms after starting kickboxing class. No fever, chills, numbness, weakness, bowel/bladder incontinence. On PE, +ttp over BL paraspinal muscles. Doubt cauda equina/cord compression. Will d/c home w/ instructions for rest, ice/heat, stretching, and ibuprofen/flexeril for symptoms. Return precautions given for new or worsening symptoms.          Shanna Cisco, MD 11/12/13 [phone]

## 2013-11-12 NOTE — Discharge Instructions (Signed)
Back Pain, Adult Low back pain is very common. About 1 in 5 people have back pain.The cause of low back pain is rarely dangerous. The pain often gets better over time.About half of people with a sudden onset of back pain feel better in just 2 weeks. About 8 in 10 people feel better by 6 weeks.  CAUSES Some common causes of back pain include:  Strain of the muscles or ligaments supporting the spine.  Wear and tear (degeneration) of the spinal discs.  Arthritis.  Direct injury to the back. DIAGNOSIS Most of the time, the direct cause of low back pain is not known.However, back pain can be treated effectively even when the exact cause of the pain is unknown.Answering your caregiver's questions about your overall health and symptoms is one of the most accurate ways to make sure the cause of your pain is not dangerous. If your caregiver needs more information, he or she may order lab work or imaging tests (X-rays or MRIs).However, even if imaging tests show changes in your back, this usually does not require surgery. HOME CARE INSTRUCTIONS For many people, back pain returns.Since low back pain is rarely dangerous, it is often a condition that people can learn to manageon their own.   Remain active. It is stressful on the back to sit or stand in one place. Do not sit, drive, or stand in one place for more than 30 minutes at a time. Take short walks on level surfaces as soon as pain allows.Try to increase the length of time you walk each day.  Do not stay in bed.Resting more than 1 or 2 days can delay your recovery.  Do not avoid exercise or work.Your body is made to move.It is not dangerous to be active, even though your back may hurt.Your back will likely heal faster if you return to being active before your pain is gone.  Pay attention to your body when you bend and lift. Many people have less discomfortwhen lifting if they bend their knees, keep the load close to their bodies,and  avoid twisting. Often, the most comfortable positions are those that put less stress on your recovering back.  Find a comfortable position to sleep. Use a firm mattress and lie on your side with your knees slightly bent. If you lie on your back, put a pillow under your knees.  Only take over-the-counter or prescription medicines as directed by your caregiver. Over-the-counter medicines to reduce pain and inflammation are often the most helpful.Your caregiver may prescribe muscle relaxant drugs.These medicines help dull your pain so you can more quickly return to your normal activities and healthy exercise.  Put ice on the injured area.  Put ice in a plastic bag.  Place a towel between your skin and the bag.  Leave the ice on for 15-20 minutes, 03-04 times a day for the first 2 to 3 days. After that, ice and heat may be alternated to reduce pain and spasms.  Ask your caregiver about trying back exercises and gentle massage. This may be of some benefit.  Avoid feeling anxious or stressed.Stress increases muscle tension and can worsen back pain.It is important to recognize when you are anxious or stressed and learn ways to manage it.Exercise is a great option. SEEK MEDICAL CARE IF:  You have pain that is not relieved with rest or medicine.  You have pain that does not improve in 1 week.  You have new symptoms.  You are generally not feeling well. SEEK   IMMEDIATE MEDICAL CARE IF:   You have pain that radiates from your back into your legs.  You develop new bowel or bladder control problems.  You have unusual weakness or numbness in your arms or legs.  You develop nausea or vomiting.  You develop abdominal pain.  You feel faint. Document Released: 06/04/2005 Document Revised: 12/04/2011 Document Reviewed: 10/23/2010 Exit[employer] Patient Information 2014 [employer]. Back Exercises Back exercises help treat and prevent back injuries. The goal of back exercises is to increase  the strength of your abdominal and back muscles and the flexibility of your back. These exercises should be started when you no longer have back pain. Back exercises include:  Pelvic Tilt. Lie on your back with your knees bent. Tilt your pelvis until the lower part of your back is against the floor. Hold this position 5 to 10 sec and repeat 5 to 10 times.  Knee to Chest. Pull first 1 knee up against your chest and hold for 20 to 30 seconds, repeat this with the other knee, and then both knees. This may be done with the other leg straight or bent, whichever feels better.  Sit-Ups or Curl-Ups. Bend your knees 90 degrees. Start with tilting your pelvis, and do a partial, slow sit-up, lifting your trunk only 30 to 45 degrees off the floor. Take at least 2 to 3 seconds for each sit-up. Do not do sit-ups with your knees out straight. If partial sit-ups are difficult, simply do the above but with only tightening your abdominal muscles and holding it as directed.  Hip-Lift. Lie on your back with your knees flexed 90 degrees. Push down with your feet and shoulders as you raise your hips a couple inches off the floor; hold for 10 seconds, repeat 5 to 10 times.  Back arches. Lie on your stomach, propping yourself up on bent elbows. Slowly press on your hands, causing an arch in your low back. Repeat 3 to 5 times. Any initial stiffness and discomfort should lessen with repetition over time.  Shoulder-Lifts. Lie face down with arms beside your body. Keep hips and torso pressed to floor as you slowly lift your head and shoulders off the floor. Do not overdo your exercises, especially in the beginning. Exercises may cause you some mild back discomfort which lasts for a few minutes; however, if the pain is more severe, or lasts for more than 15 minutes, do not continue exercises until you see your caregiver. Improvement with exercise therapy for back problems is slow.  See your caregivers for assistance with  developing a proper back exercise program. Document Released: 07/12/2004 Document Revised: 08/27/2011 Document Reviewed: 04/05/2011 Exit[employer] Patient Information 2014 [employer].  

## 2013-12-22 ENCOUNTER — Emergency Department (HOSPITAL_BASED_OUTPATIENT_CLINIC_OR_DEPARTMENT_OTHER): Payer: Self-pay

## 2013-12-22 ENCOUNTER — Emergency Department (HOSPITAL_BASED_OUTPATIENT_CLINIC_OR_DEPARTMENT_OTHER)
Admission: EM | Admit: 2013-12-22 | Discharge: 2013-12-22 | Disposition: A | Discharge status: Home / Self Care | Payer: Self-pay | Attending: Emergency Medicine | Admitting: Emergency Medicine

## 2013-12-22 ENCOUNTER — Encounter (HOSPITAL_BASED_OUTPATIENT_CLINIC_OR_DEPARTMENT_OTHER): Payer: Self-pay | Admitting: Emergency Medicine

## 2013-12-22 DIAGNOSIS — Z8719 Personal history of other diseases of the digestive system: Secondary | ICD-10-CM | POA: Insufficient documentation

## 2013-12-22 DIAGNOSIS — Y929 Unspecified place or not applicable: Secondary | ICD-10-CM | POA: Insufficient documentation

## 2013-12-22 DIAGNOSIS — Z8739 Personal history of other diseases of the musculoskeletal system and connective tissue: Secondary | ICD-10-CM | POA: Insufficient documentation

## 2013-12-22 DIAGNOSIS — Z792 Long term (current) use of antibiotics: Secondary | ICD-10-CM | POA: Insufficient documentation

## 2013-12-22 DIAGNOSIS — S63502A Unspecified sprain of left wrist, initial encounter: Secondary | ICD-10-CM

## 2013-12-22 DIAGNOSIS — F410 Panic disorder [episodic paroxysmal anxiety] without agoraphobia: Secondary | ICD-10-CM | POA: Insufficient documentation

## 2013-12-22 DIAGNOSIS — F319 Bipolar disorder, unspecified: Secondary | ICD-10-CM | POA: Insufficient documentation

## 2013-12-22 DIAGNOSIS — Z79899 Other long term (current) drug therapy: Secondary | ICD-10-CM | POA: Insufficient documentation

## 2013-12-22 DIAGNOSIS — W19XXXA Unspecified fall, initial encounter: Secondary | ICD-10-CM | POA: Insufficient documentation

## 2013-12-22 DIAGNOSIS — F411 Generalized anxiety disorder: Secondary | ICD-10-CM | POA: Insufficient documentation

## 2013-12-22 DIAGNOSIS — G8929 Other chronic pain: Secondary | ICD-10-CM | POA: Insufficient documentation

## 2013-12-22 DIAGNOSIS — S63509A Unspecified sprain of unspecified wrist, initial encounter: Secondary | ICD-10-CM | POA: Insufficient documentation

## 2013-12-22 DIAGNOSIS — Y939 Activity, unspecified: Secondary | ICD-10-CM | POA: Insufficient documentation

## 2013-12-22 DIAGNOSIS — Z87891 Personal history of nicotine dependence: Secondary | ICD-10-CM | POA: Insufficient documentation

## 2013-12-22 NOTE — ED Provider Notes (Signed)
[name]: [id number]     Arrival date & time 12/22/13  1658 History   First MD Initiated Contact with Patient 12/22/13 1722     Chief Complaint  Patient presents with  . Wrist Pain     (Consider location/radiation/quality/duration/timing/severity/associated sxs/prior Treatment) Patient is a 33 y.o. male presenting with wrist pain. The history is provided by the patient. No language interpreter was used.  Wrist Pain This is a new problem. The current episode started today. The problem occurs constantly. The problem has been gradually worsening. Associated symptoms include joint swelling and myalgias. Nothing aggravates the symptoms. He has tried nothing for the symptoms. The treatment provided no relief.  Pt reports he fell several days ago.  Pt reports continuing to have wrist and hand pain.   Pt reports he feel while skating.    Past Medical History  Diagnosis Date  . Back pain   . Gastric ulcer, chronic   . Depression   . Anxiety   . Chronic pain   . Arthritis   . Bipolar 1 disorder   . Panic attacks   . Substance induced mood disorder 07/19/2012   Past Surgical History  Procedure Laterality Date  . None     No family history on file. History  Substance Use Topics  . Smoking status: Former Smoker -- 1.00 packs/day for 15 years    Types: Cigarettes    Quit date: 11/08/2013  . Smokeless tobacco: Never Used  . Alcohol Use: No    Review of Systems  Musculoskeletal: Positive for joint swelling and myalgias.  All other systems reviewed and are negative.     Allergies  Benadryl; Nyquil; Tylenol; and Vistaril  Home Medications   Prior to Admission medications   Medication Sig Start Date End Date Taking? Authorizing Provider  benztropine (COGENTIN) 0.5 MG tablet Take 1 tablet (0.5 mg total) by mouth 2 (two) times daily as needed. 07/21/12   Nanine MeansJamison Lord, NP  cephALEXin (KEFLEX) 500 MG capsule Take 1 capsule (500 mg total) by mouth 4 (four) times daily. 06/17/13   Elson AreasLeslie K  Laurette Villescas, PA-C  cloNIDine (CATAPRES) 0.1 MG tablet Take 1 tablet (0.1 mg total) by mouth 2 (two) times daily. 07/21/12   Nanine MeansJamison Lord, NP  cyclobenzaprine (FLEXERIL) 10 MG tablet Take 1 tablet (10 mg total) by mouth 2 (two) times daily as needed for muscle spasms. 11/12/13   Shanna CiscoMegan E Docherty, MD  DULoxetine (CYMBALTA) 30 MG capsule Take 1 capsule (30 mg total) by mouth daily. 07/21/12   Nanine MeansJamison Lord, NP  gabapentin (NEURONTIN) 300 MG capsule Take 1 capsule (300 mg total) by mouth 3 (three) times daily. 07/21/12   Nanine MeansJamison Lord, NP  HYDROcodone-ibuprofen (VICOPROFEN) 7.5-200 MG per tablet Take 1 tablet by mouth every 6 (six) hours as needed for moderate pain. 06/17/13   Elson AreasLeslie K Kandace Elrod, PA-C  pantoprazole (PROTONIX) 40 MG tablet Take 1 tablet (40 mg total) by mouth daily. 07/21/12   Nanine MeansJamison Lord, NP  simvastatin (ZOCOR) 20 MG tablet Take 1 tablet (20 mg total) by mouth at bedtime. 07/21/12   Nanine MeansJamison Lord, NP  tobramycin (TOBREX) 0.3 % ophthalmic solution Place 2 drops into the right eye every 4 (four) hours. 06/17/13   Elson AreasLeslie K Noami Bove, PA-C  traZODone (DESYREL) 50 MG tablet Take 1 tablet (50 mg total) by mouth at bedtime as needed for sleep. 07/21/12   Nanine MeansJamison Lord, NP  ziprasidone (GEODON) 40 MG capsule Take 1 capsule (40 mg total) by mouth 2 (two) times daily with  a meal. 07/21/12   Nanine MeansJamison Lord, NP   BP 126/68  Pulse 55  Temp(Src) 98.2 F (36.8 C) (Oral)  Resp 16  Ht 6' (1.829 m)  Wt 178 lb (80.74 kg)  BMI 24.14 kg/m2  SpO2 100% Physical Exam  Nursing note and vitals reviewed. Constitutional: He is oriented to person, place, and time. He appears well-developed and well-nourished.  HENT:  Head: Normocephalic.  Eyes: EOM are normal.  Neck: Normal range of motion.  Musculoskeletal:  Swollen tender left wrist,  No deformity  Neurological: He is alert and oriented to person, place, and time.  Psychiatric: He has a normal mood and affect.    ED Course  Procedures (including critical care time) Labs  Review Labs Reviewed - No data to display  Imaging Review No results found.   EKG Interpretation None      MDM   Final diagnoses:  None    Wrist splint  Ibuprofen     Elson AreasLeslie K Betsaida [place], PA-C 12/22/13 1814

## 2013-12-22 NOTE — Discharge Instructions (Signed)
Joint Sprain °A sprain is a tear or stretch in the ligaments that hold a joint together. Severe sprains may need as long as 3-6 weeks of immobilization and/or exercises to heal completely. Sprained joints should be rested and protected. If not, they can become unstable and prone to re-injury. Proper treatment can reduce your pain, shorten the period of disability, and reduce the risk of repeated injuries. °TREATMENT  °· Rest and elevate the injured joint to reduce pain and swelling. °· Apply ice packs to the injury for 20-30 minutes every 2-3 hours for the next 2-3 days. °· Keep the injury wrapped in a compression bandage or splint as long as the joint is painful or as instructed by your caregiver. °· Do not use the injured joint until it is completely healed to prevent re-injury and chronic instability. Follow the instructions of your caregiver. °· Long-term sprain management may require exercises and/or treatment by a physical therapist. Taping or special braces may help stabilize the joint until it is completely better. °SEEK MEDICAL CARE IF:  °· You develop increased pain or swelling of the joint. °· You develop increasing redness and warmth of the joint. °· You develop a fever. °· It becomes stiff. °· Your hand or foot gets cold or numb. °Document Released: 07/12/2004 Document Revised: 08/27/2011 Document Reviewed: 06/21/2008 °[employer]® Patient Information ©2015 [employer]. This information is not intended to replace advice given to you by your health care provider. Make sure you discuss any questions you have with your health care provider. ° °

## 2013-12-22 NOTE — ED Notes (Signed)
Reports left wrist pain. Sts working out intensely. Sts pain is "unbearable."

## 2013-12-28 NOTE — ED Provider Notes (Signed)
Medical screening examination/treatment/procedure(s) were performed by non-physician practitioner and as supervising physician I was immediately available for consultation/collaboration.   EKG Interpretation None        Shon Batonourtney F Esmae Donathan, MD 12/28/13 [id number]

## 2014-03-09 ENCOUNTER — Emergency Department (HOSPITAL_BASED_OUTPATIENT_CLINIC_OR_DEPARTMENT_OTHER): Payer: Self-pay

## 2014-03-09 ENCOUNTER — Encounter (HOSPITAL_BASED_OUTPATIENT_CLINIC_OR_DEPARTMENT_OTHER): Payer: Self-pay | Admitting: Emergency Medicine

## 2014-03-09 ENCOUNTER — Emergency Department (HOSPITAL_BASED_OUTPATIENT_CLINIC_OR_DEPARTMENT_OTHER)
Admission: EM | Admit: 2014-03-09 | Discharge: 2014-03-09 | Disposition: A | Discharge status: Home / Self Care | Payer: Self-pay | Attending: Emergency Medicine | Admitting: Emergency Medicine

## 2014-03-09 DIAGNOSIS — R0789 Other chest pain: Secondary | ICD-10-CM

## 2014-03-09 DIAGNOSIS — F319 Bipolar disorder, unspecified: Secondary | ICD-10-CM | POA: Insufficient documentation

## 2014-03-09 DIAGNOSIS — IMO0002 Reserved for concepts with insufficient information to code with codable children: Secondary | ICD-10-CM | POA: Insufficient documentation

## 2014-03-09 DIAGNOSIS — Z8719 Personal history of other diseases of the digestive system: Secondary | ICD-10-CM | POA: Insufficient documentation

## 2014-03-09 DIAGNOSIS — M129 Arthropathy, unspecified: Secondary | ICD-10-CM | POA: Insufficient documentation

## 2014-03-09 DIAGNOSIS — T07XXXA Unspecified multiple injuries, initial encounter: Secondary | ICD-10-CM | POA: Insufficient documentation

## 2014-03-09 DIAGNOSIS — Y9389 Activity, other specified: Secondary | ICD-10-CM | POA: Insufficient documentation

## 2014-03-09 DIAGNOSIS — H6690 Otitis media, unspecified, unspecified ear: Secondary | ICD-10-CM | POA: Insufficient documentation

## 2014-03-09 DIAGNOSIS — Z79899 Other long term (current) drug therapy: Secondary | ICD-10-CM | POA: Insufficient documentation

## 2014-03-09 DIAGNOSIS — S060X9A Concussion with loss of consciousness of unspecified duration, initial encounter: Secondary | ICD-10-CM | POA: Insufficient documentation

## 2014-03-09 DIAGNOSIS — S298XXA Other specified injuries of thorax, initial encounter: Secondary | ICD-10-CM | POA: Insufficient documentation

## 2014-03-09 DIAGNOSIS — F410 Panic disorder [episodic paroxysmal anxiety] without agoraphobia: Secondary | ICD-10-CM | POA: Insufficient documentation

## 2014-03-09 DIAGNOSIS — Z792 Long term (current) use of antibiotics: Secondary | ICD-10-CM | POA: Insufficient documentation

## 2014-03-09 DIAGNOSIS — S0990XA Unspecified injury of head, initial encounter: Secondary | ICD-10-CM | POA: Insufficient documentation

## 2014-03-09 DIAGNOSIS — Y92410 Unspecified street and highway as the place of occurrence of the external cause: Secondary | ICD-10-CM | POA: Insufficient documentation

## 2014-03-09 MED ORDER — OXYCODONE HCL 5 MG PO TABS
10.0000 mg | ORAL_TABLET | Freq: Once | ORAL | Status: AC
  Administered 2014-03-09: 10 mg via ORAL
  Filled 2014-03-09: qty 2

## 2014-03-09 MED ORDER — OXYCODONE HCL 5 MG PO TABS
5.0000 mg | ORAL_TABLET | Freq: Four times a day (QID) | ORAL | Status: DC | PRN
Start: 2014-03-09 — End: 2014-05-05

## 2014-03-09 MED ORDER — ONDANSETRON 4 MG PO TBDP
4.0000 mg | ORAL_TABLET | Freq: Once | ORAL | Status: AC
Start: 2014-03-09 — End: 2014-03-09
  Administered 2014-03-09: 4 mg via ORAL
  Filled 2014-03-09: qty 1

## 2014-03-09 MED ORDER — IBUPROFEN 800 MG PO TABS: 800.0000 mg | ORAL_TABLET | Freq: Three times a day (TID) | ORAL | Status: DC | PRN

## 2014-03-09 MED ORDER — BACITRACIN 500 UNIT/GM EX OINT
1.0000 "application " | TOPICAL_OINTMENT | Freq: Once | CUTANEOUS | Status: AC
  Administered 2014-03-09: 1 via TOPICAL
  Filled 2014-03-09: qty 0.9
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
## 2014-03-09 NOTE — ED Notes (Signed)
Pt wounds cleaned and covered with bacitracin and wrapped in roller gauze.

## 2014-03-09 NOTE — Discharge Instructions (Signed)
Abrasion An abrasion is a cut or scrape of the skin. Abrasions do not extend through all layers of the skin and most heal within 10 days. It is important to care for your abrasion properly to prevent infection. CAUSES  Most abrasions are caused by falling on, or gliding across, the ground or other surface. When your skin rubs on something, the outer and inner layer of skin rubs off, causing an abrasion. DIAGNOSIS  Your caregiver will be able to diagnose an abrasion during a physical exam.  TREATMENT  Your treatment depends on how large and deep the abrasion is. Generally, your abrasion will be cleaned with water and a mild soap to remove any dirt or debris. An antibiotic ointment may be put over the abrasion to prevent an infection. A bandage (dressing) may be wrapped around the abrasion to keep it from getting dirty.  You may need a tetanus shot if:  You cannot remember when you had your last tetanus shot.  You have never had a tetanus shot.  The injury broke your skin. If you get a tetanus shot, your arm may swell, get red, and feel warm to the touch. This is common and not a problem. If you need a tetanus shot and you choose not to have one, there is a rare chance of getting tetanus. Sickness from tetanus can be serious.  HOME CARE INSTRUCTIONS   If a dressing was applied, change it at least once a day or as directed by your caregiver. If the bandage sticks, soak it off with warm water.   Wash the area with water and a mild soap to remove all the ointment 2 times a day. Rinse off the soap and pat the area dry with a clean towel.   Reapply any ointment as directed by your caregiver. This will help prevent infection and keep the bandage from sticking. Use gauze over the wound and under the dressing to help keep the bandage from sticking.   Change your dressing right away if it becomes wet or dirty.   Only take over-the-counter or prescription medicines for pain, discomfort, or fever as  directed by your caregiver.   Follow up with your caregiver within 24-48 hours for a wound check, or as directed. If you were not given a wound-check appointment, look closely at your abrasion for redness, swelling, or pus. These are signs of infection. SEEK IMMEDIATE MEDICAL CARE IF:   You have increasing pain in the wound.   You have redness, swelling, or tenderness around the wound.   You have pus coming from the wound.   You have a fever or persistent symptoms for more than 2-3 days.  You have a fever and your symptoms suddenly get worse.  You have a bad smell coming from the wound or dressing.  MAKE SURE YOU:   Understand these instructions.  Will watch your condition.  Will get help right away if you are not doing well or get worse. Document Released: 03/14/2005 Document Revised: 05/21/2012 Document Reviewed: 05/08/2011 [facility] Patient Information 2015 [place], [place]. This information is not intended to replace advice given to you by your health care provider. Make sure you discuss any questions you have with your health care provider.  Chest Wall Pain Chest wall pain is pain in or around the bones and muscles of your chest. It may take up to 6 weeks to get better. It may take longer if you must stay physically active in your work and activities.  CAUSES  Chest wall pain may happen on its own. However, it may be caused by:  A viral illness like the flu.  Injury.  Coughing.  Exercise.  Arthritis.  Fibromyalgia.  Shingles. HOME CARE INSTRUCTIONS   Avoid overtiring physical activity. Try not to strain or perform activities that cause pain. This includes any activities using your chest or your abdominal and side muscles, especially if heavy weights are used.  Put ice on the sore area.  Put ice in a plastic bag.  Place a towel between your skin and the bag.  Leave the ice on for 15-20 minutes per hour while awake for the first 2 days.  Only take  over-the-counter or prescription medicines for pain, discomfort, or fever as directed by your caregiver. SEEK IMMEDIATE MEDICAL CARE IF:   Your pain increases, or you are very uncomfortable.  You have a fever.  Your chest pain becomes worse.  You have new, unexplained symptoms.  You have nausea or vomiting.  You feel sweaty or lightheaded.  You have a cough with phlegm (sputum), or you cough up blood. MAKE SURE YOU:   Understand these instructions.  Will watch your condition.  Will get help right away if you are not doing well or get worse. Document Released: 06/04/2005 Document Revised: 08/27/2011 Document Reviewed: 01/29/2011 [facility] Patient Information 2015 [place], [place]. This information is not intended to replace advice given to you by your health care provider. Make sure you discuss any questions you have with your health care provider.  Contusion A contusion is a deep bruise. Contusions are the result of an injury that caused bleeding under the skin. The contusion may turn blue, purple, or yellow. Minor injuries will give you a painless contusion, but more severe contusions may stay painful and swollen for a few weeks.  CAUSES  A contusion is usually caused by a blow, trauma, or direct force to an area of the body. SYMPTOMS   Swelling and redness of the injured area.  Bruising of the injured area.  Tenderness and soreness of the injured area.  Pain. DIAGNOSIS  The diagnosis can be made by taking a history and physical exam. An X-ray, CT scan, or MRI may be needed to determine if there were any associated injuries, such as fractures. TREATMENT  Specific treatment will depend on what area of the body was injured. In general, the best treatment for a contusion is resting, icing, elevating, and applying cold compresses to the injured area. Over-the-counter medicines may also be recommended for pain control. Ask your caregiver what the best treatment is for your  contusion. HOME CARE INSTRUCTIONS   Put ice on the injured area.  Put ice in a plastic bag.  Place a towel between your skin and the bag.  Leave the ice on for 15-20 minutes, 3-4 times a day, or as directed by your health care provider.  Only take over-the-counter or prescription medicines for pain, discomfort, or fever as directed by your caregiver. Your caregiver may recommend avoiding anti-inflammatory medicines (aspirin, ibuprofen, and naproxen) for 48 hours because these medicines may increase bruising.  Rest the injured area.  If possible, elevate the injured area to reduce swelling. SEEK IMMEDIATE MEDICAL CARE IF:   You have increased bruising or swelling.  You have pain that is getting worse.  Your swelling or pain is not relieved with medicines. MAKE SURE YOU:   Understand these instructions.  Will watch your condition.  Will get help right away if you are not doing  well or get worse. Document Released: 03/14/2005 Document Revised: 06/09/2013 Document Reviewed: 04/09/2011 [facility] Patient Information 2015 [place], [place]. This information is not intended to replace advice given to you by your health care provider. Make sure you discuss any questions you have with your health care provider.  Head Injury You have received a head injury. It does not appear serious at this time. Headaches and vomiting are common following head injury. It should be easy to awaken from sleeping. Sometimes it is necessary for you to stay in the emergency department for a while for observation. Sometimes admission to the hospital may be needed. After injuries such as yours, most problems occur within the first 24 hours, but side effects may occur up to 7-10 days after the injury. It is important for you to carefully monitor your condition and contact your health care provider or seek immediate medical care if there is a change in your condition. WHAT ARE THE TYPES OF HEAD INJURIES? Head injuries  can be as minor as a bump. Some head injuries can be more severe. More severe head injuries include:  A jarring injury to the brain (concussion).  A bruise of the brain (contusion). This mean there is bleeding in the brain that can cause swelling.  A cracked skull (skull fracture).  Bleeding in the brain that collects, clots, and forms a bump (hematoma). WHAT CAUSES A HEAD INJURY? A serious head injury is most likely to happen to someone who is in a car wreck and is not wearing a seat belt. Other causes of major head injuries include bicycle or motorcycle accidents, sports injuries, and falls. HOW ARE HEAD INJURIES DIAGNOSED? A complete history of the event leading to the injury and your current symptoms will be helpful in diagnosing head injuries. Many times, pictures of the brain, such as CT or MRI are needed to see the extent of the injury. Often, an overnight hospital stay is necessary for observation.  WHEN SHOULD I SEEK IMMEDIATE MEDICAL CARE?  You should get help right away if:  You have confusion or drowsiness.  You feel sick to your stomach (nauseous) or have continued, forceful vomiting.  You have dizziness or unsteadiness that is getting worse.  You have severe, continued headaches not relieved by medicine. Only take over-the-counter or prescription medicines for pain, fever, or discomfort as directed by your health care provider.  You do not have normal function of the arms or legs or are unable to walk.  You notice changes in the black spots in the center of the colored part of your eye (pupil).  You have a clear or bloody fluid coming from your nose or ears.  You have a loss of vision. During the next 24 hours after the injury, you must stay with someone who can watch you for the warning signs. This person should contact local emergency services (911 in the [place] if you have seizures, you become unconscious, or you are unable to wake up. HOW CAN I PREVENT A HEAD INJURY  IN THE FUTURE? The most important factor for preventing major head injuries is avoiding motor vehicle accidents. To minimize the potential for damage to your head, it is crucial to wear seat belts while riding in motor vehicles. Wearing helmets while bike riding and playing collision sports (like football) is also helpful. Also, avoiding dangerous activities around the house will further help reduce your risk of head injury.  WHEN CAN I RETURN TO NORMAL ACTIVITIES AND ATHLETICS? You should be reevaluated  by your health care provider before returning to these activities. If you have any of the following symptoms, you should not return to activities or contact sports until 1 week after the symptoms have stopped:  Persistent headache.  Dizziness or vertigo.  Poor attention and concentration.  Confusion.  Memory problems.  Nausea or vomiting.  Fatigue or tire easily.  Irritability.  Intolerant of bright lights or loud noises.  Anxiety or depression.  Disturbed sleep. MAKE SURE YOU:   Understand these instructions.  Will watch your condition.  Will get help right away if you are not doing well or get worse. Document Released: 06/04/2005 Document Revised: 06/09/2013 Document Reviewed: 02/09/2013 [facility] Patient Information 2015 [place], [place]. This information is not intended to replace advice given to you by your health care provider. Make sure you discuss any questions you have with your health care provider. RICE: Routine Care for Injuries The routine care of many injuries includes Rest, Ice, Compression, and Elevation (RICE). HOME CARE INSTRUCTIONS  Rest is needed to allow your body to heal. Routine activities can usually be resumed when comfortable. Injured tendons and bones can take up to 6 weeks to heal. Tendons are the cord-like structures that attach muscle to bone.  Ice following an injury helps keep the swelling down and reduces pain.  Put ice in a plastic  bag.  Place a towel between your skin and the bag.  Leave the ice on for 15-20 minutes, 3-4 times a day, or as directed by your health care provider. Do this while awake, for the first 24 to 48 hours. After that, continue as directed by your caregiver.  Compression helps keep swelling down. It also gives support and helps with discomfort. If an elastic bandage has been applied, it should be removed and reapplied every 3 to 4 hours. It should not be applied tightly, but firmly enough to keep swelling down. Watch fingers or toes for swelling, bluish discoloration, coldness, numbness, or excessive pain. If any of these problems occur, remove the bandage and reapply loosely. Contact your caregiver if these problems continue.  Elevation helps reduce swelling and decreases pain. With extremities, such as the arms, hands, legs, and feet, the injured area should be placed near or above the level of the heart, if possible. SEEK IMMEDIATE MEDICAL CARE IF:  You have persistent pain and swelling.  You develop redness, numbness, or unexpected weakness.  Your symptoms are getting worse rather than improving after several days. These symptoms may indicate that further evaluation or further X-rays are needed. Sometimes, X-rays may not show a small broken bone (fracture) until 1 week or 10 days later. Make a follow-up appointment with your caregiver. Ask when your X-ray results will be ready. Make sure you get your X-ray results. Document Released: 09/16/2000 Document Revised: 06/09/2013 Document Reviewed: 11/03/2010 [facility] Patient Information 2015 [place], [place]. This information is not intended to replace advice given to you by your health care provider. Make sure you discuss any questions you have with your health care provider.

## 2014-05-05 ENCOUNTER — Encounter (HOSPITAL_BASED_OUTPATIENT_CLINIC_OR_DEPARTMENT_OTHER): Payer: Self-pay

## 2014-05-05 ENCOUNTER — Emergency Department (HOSPITAL_BASED_OUTPATIENT_CLINIC_OR_DEPARTMENT_OTHER): Payer: 59

## 2014-05-05 ENCOUNTER — Emergency Department (HOSPITAL_BASED_OUTPATIENT_CLINIC_OR_DEPARTMENT_OTHER)
Admission: EM | Admit: 2014-05-05 | Discharge: 2014-05-05 | Disposition: A | Discharge status: Home / Self Care | Payer: 59 | Attending: Emergency Medicine | Admitting: Emergency Medicine

## 2014-05-05 DIAGNOSIS — Z720 Tobacco use: Secondary | ICD-10-CM | POA: Insufficient documentation

## 2014-05-05 DIAGNOSIS — Y998 Other external cause status: Secondary | ICD-10-CM | POA: Diagnosis not present

## 2014-05-05 DIAGNOSIS — S46911A Strain of unspecified muscle, fascia and tendon at shoulder and upper arm level, right arm, initial encounter: Secondary | ICD-10-CM

## 2014-05-05 DIAGNOSIS — Y9389 Activity, other specified: Secondary | ICD-10-CM | POA: Diagnosis not present

## 2014-05-05 DIAGNOSIS — Y9289 Other specified places as the place of occurrence of the external cause: Secondary | ICD-10-CM | POA: Diagnosis not present

## 2014-05-05 DIAGNOSIS — T1490XA Injury, unspecified, initial encounter: Secondary | ICD-10-CM

## 2014-05-05 DIAGNOSIS — G8929 Other chronic pain: Secondary | ICD-10-CM | POA: Insufficient documentation

## 2014-05-05 DIAGNOSIS — Z8659 Personal history of other mental and behavioral disorders: Secondary | ICD-10-CM | POA: Diagnosis not present

## 2014-05-05 DIAGNOSIS — Z8719 Personal history of other diseases of the digestive system: Secondary | ICD-10-CM | POA: Diagnosis not present

## 2014-05-05 DIAGNOSIS — S53401A Unspecified sprain of right elbow, initial encounter: Secondary | ICD-10-CM | POA: Insufficient documentation

## 2014-05-05 DIAGNOSIS — S59901A Unspecified injury of right elbow, initial encounter: Secondary | ICD-10-CM | POA: Diagnosis present

## 2014-05-05 DIAGNOSIS — W1789XA Other fall from one level to another, initial encounter: Secondary | ICD-10-CM | POA: Insufficient documentation

## 2014-05-05 MED ORDER — OXYCODONE HCL 5 MG PO TABS
10.0000 mg | ORAL_TABLET | Freq: Once | ORAL | Status: AC
  Administered 2014-05-05: 5 mg via ORAL
  Filled 2014-05-05: qty 2

## 2014-05-05 MED ORDER — IBUPROFEN 800 MG PO TABS: 800.0000 mg | ORAL_TABLET | Freq: Three times a day (TID) | ORAL | Status: AC | PRN

## 2014-05-05 MED ORDER — OXYCODONE HCL 5 MG PO TABS: 5.0000 mg | ORAL_TABLET | Freq: Four times a day (QID) | ORAL | Status: DC | PRN

## 2014-05-05 NOTE — ED Provider Notes (Signed)
[name]: [id number]     Arrival date & time 05/05/14  1800 History  This chart was scribed for Audree CamelScott T Sharetta Ricchio, MD by Gwenyth Oberatherine Macek, ED Scribe. This patient was seen in room MH07/MH07 and the patient's care was started at 6:32 PM.    Chief Complaint  Patient presents with  . Fall   The history is provided by the patient. No language interpreter was used.   HPI Comments: Casey Mercer is a 33 y.o. male who presents to the Emergency Department complaining of gradually worsening right elbow pain that radiates to his forearm and started 2 days ago when he fell from a fence. He states feeling "a pop" in right elbow yesterday which caused worsening pain. He notes limited ROM and decreased grip strength due to pain as associated symptoms. Pt states pain becomes worse with movement and squeezing. He has not tried any treatment for his symptoms. Pt denies other injuries. Pain is rated as severe.  Past Medical History  Diagnosis Date  . Back pain   . Gastric ulcer, chronic   . Depression   . Anxiety   . Chronic pain   . Arthritis   . Bipolar 1 disorder   . Panic attacks   . Substance induced mood disorder 07/19/2012   Past Surgical History  Procedure Laterality Date  . None     No family history on file. History  Substance Use Topics  . Smoking status: Current Every Day Smoker -- 0.00 packs/day for 0 years  . Smokeless tobacco: Never Used  . Alcohol Use: No    Review of Systems  Musculoskeletal: Positive for arthralgias. Negative for joint swelling.  Skin: Negative for wound.  Neurological: Negative for weakness and numbness.  All other systems reviewed and are negative.   Allergies  Benadryl; Nyquil; Tylenol; and Vistaril  Home Medications   Prior to Admission medications   Medication Sig Start Date End Date Taking? Authorizing Provider  ibuprofen (ADVIL,MOTRIN) 800 MG tablet Take 1 tablet (800 mg total) by mouth every 8 (eight) hours as needed for mild pain or moderate pain.  03/09/14   Kristen N Ward, DO   BP 152/93 mmHg  Pulse 71  Temp(Src) 98.9 F (37.2 C) (Oral)  Resp 16  Ht 6' (1.829 m)  Wt 180 lb (81.647 kg)  BMI 24.41 kg/m2  SpO2 99% Physical Exam  Constitutional: He appears well-developed and well-nourished. No distress.  HENT:  Head: Normocephalic and atraumatic.  Neck: No tracheal deviation present.  Cardiovascular: Intact distal pulses.   Normal radial pulses  Pulmonary/Chest: Effort normal. No respiratory distress.  Musculoskeletal: Normal range of motion. He exhibits tenderness. He exhibits no edema.  Small ecchymosis to right mid/upper arm; Point tenderness to lateral epicondyle; Normal ROM; No swelling; Normal sensation and movement of hand  Skin: Skin is warm and dry.  Psychiatric: He has a normal mood and affect. His behavior is normal.  Nursing note and vitals reviewed.   ED Course  Procedures (including critical care time) DIAGNOSTIC STUDIES: Oxygen Saturation is 99% on RA, normal by my interpretation.    COORDINATION OF CARE: 6:37 PM Discussed treatment plan which includes elbow x-ray and pt agreed to plan.  Labs Review Labs Reviewed - No data to display  Imaging Review Dg Elbow Complete Right  05/05/2014   CLINICAL DATA:  Right elbow pain after fall.  EXAM: RIGHT ELBOW - COMPLETE 3+ VIEW  COMPARISON:  None.  FINDINGS: There is no evidence of fracture, dislocation, or joint  effusion. There is no evidence of arthropathy or other focal bone abnormality. Soft tissues are unremarkable.  IMPRESSION: Normal right elbow.   Electronically Signed   By: Roque LiasJames  Green M.D.   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 NV intact. Xray negative. No significant swelling. Will treat symptomatically and refer to ortho.  I personally performed the services described in  this documentation, which was scribed in my presence. The recorded information has been reviewed and is accurate.    Audree CamelScott T Collette Pescador, MD 05/06/14 [phone]

## 2014-05-05 NOTE — ED Notes (Signed)
Fell 2 days ago-pain from right elbow down to right hand

## 2014-05-05 NOTE — ED Notes (Signed)
Pt instructed not to drive states girlfiend will drive

## 2014-05-05 NOTE — Discharge Instructions (Signed)
Elbow Contusion An elbow contusion is a deep bruise of the elbow. Contusions are the result of an injury that caused bleeding under the skin. The contusion may turn blue, purple, or yellow. Minor injuries will give you a painless contusion, but more severe contusions may stay painful and swollen for a few weeks.  CAUSES  An elbow contusion comes from a direct force to that area, such as falling on the elbow. SYMPTOMS   Swelling and redness of the elbow.  Bruising of the elbow area.  Tenderness or soreness of the elbow. DIAGNOSIS  You will have a physical exam and will be asked about your history. You may need an X-ray of your elbow to look for a broken bone (fracture).  TREATMENT  A sling or splint may be needed to support your injury. Resting, elevating, and applying cold compresses to the elbow area are often the best treatments for an elbow contusion. Over-the-counter medicines may also be recommended for pain control. HOME CARE INSTRUCTIONS   Put ice on the injured area.  Put ice in a plastic bag.  Place a towel between your skin and the bag.  Leave the ice on for 15-20 minutes, 03-04 times a day.  Only take over-the-counter or prescription medicines for pain, discomfort, or fever as directed by your caregiver.  Rest your injured elbow until the pain and swelling are better.  Elevate your elbow to reduce swelling.  Apply a compression wrap as directed by your caregiver. This can help reduce swelling and motion. You may remove the wrap for sleeping, showers, and baths. If your fingers become numb, cold, or blue, take the wrap off and reapply it more loosely.  Use your elbow only as directed by your caregiver. You may be asked to do range of motion exercises. Do them as directed.  See your caregiver as directed. It is very important to keep all follow-up appointments in order to avoid any long-term problems with your elbow, including chronic pain or inability to move your elbow  normally. SEEK IMMEDIATE MEDICAL CARE IF:   You have increased redness, swelling, or pain in your elbow.  Your swelling or pain is not relieved with medicines.  You have swelling of the hand and fingers.  You are unable to move your fingers or wrist.  You begin to lose feeling in your hand or fingers.  Your fingers or hand become cold or blue. MAKE SURE YOU:   Understand these instructions.  Will watch your condition.  Will get help right away if you are not doing well or get worse. Document Released: 05/13/2006 Document Revised: 08/27/2011 Document Reviewed: 04/20/2011 ExitCare Patient Information 2015 [employer]. This information is not intended to replace advice given to you by your health care provider. Make sure you discuss any questions you have with your health care provider.  

## 2014-05-31 ENCOUNTER — Emergency Department (HOSPITAL_BASED_OUTPATIENT_CLINIC_OR_DEPARTMENT_OTHER)
Admission: EM | Admit: 2014-05-31 | Discharge: 2014-05-31 | Disposition: A | Discharge status: Home / Self Care | Payer: 59 | Attending: Emergency Medicine | Admitting: Emergency Medicine

## 2014-05-31 ENCOUNTER — Encounter (HOSPITAL_BASED_OUTPATIENT_CLINIC_OR_DEPARTMENT_OTHER): Payer: Self-pay

## 2014-05-31 DIAGNOSIS — Z202 Contact with and (suspected) exposure to infections with a predominantly sexual mode of transmission: Secondary | ICD-10-CM

## 2014-05-31 DIAGNOSIS — G8929 Other chronic pain: Secondary | ICD-10-CM | POA: Insufficient documentation

## 2014-05-31 DIAGNOSIS — R238 Other skin changes: Secondary | ICD-10-CM | POA: Diagnosis not present

## 2014-05-31 DIAGNOSIS — M1990 Unspecified osteoarthritis, unspecified site: Secondary | ICD-10-CM | POA: Diagnosis not present

## 2014-05-31 DIAGNOSIS — Z8659 Personal history of other mental and behavioral disorders: Secondary | ICD-10-CM | POA: Diagnosis not present

## 2014-05-31 DIAGNOSIS — Z720 Tobacco use: Secondary | ICD-10-CM | POA: Diagnosis not present

## 2014-05-31 DIAGNOSIS — Z8719 Personal history of other diseases of the digestive system: Secondary | ICD-10-CM | POA: Diagnosis not present

## 2014-05-31 DIAGNOSIS — R51 Headache: Secondary | ICD-10-CM | POA: Diagnosis present

## 2014-05-31 LAB — URINALYSIS, ROUTINE W REFLEX MICROSCOPIC
BILIRUBIN URINE: NEGATIVE
GLUCOSE, UA: NEGATIVE mg/dL
HGB URINE DIPSTICK: NEGATIVE
Ketones, ur: NEGATIVE mg/dL
Leukocytes, UA: NEGATIVE
Nitrite: NEGATIVE
Protein, ur: NEGATIVE mg/dL
Specific Gravity, Urine: 1.018 (ref 1.005–1.030)
Urobilinogen, UA: 0.2 mg/dL (ref 0.0–1.0)
pH: 8 (ref 5.0–8.0)

## 2014-05-31 LAB — URINE MICROSCOPIC-ADD ON

## 2014-05-31 LAB — HIV ANTIBODY (ROUTINE TESTING W REFLEX): HIV 1&2 Ab, 4th Generation: NONREACTIVE

## 2014-05-31 LAB — RPR

## 2014-05-31 MED ORDER — AZITHROMYCIN 1 G PO PACK
1.0000 g | PACK | Freq: Once | ORAL | Status: AC
  Administered 2014-05-31: 1 g via ORAL
  Filled 2014-05-31: qty 1

## 2014-05-31 MED ORDER — CEFTRIAXONE SODIUM 250 MG IJ SOLR
250.0000 mg | Freq: Once | INTRAMUSCULAR | Status: AC
  Administered 2014-05-31: 250 mg via INTRAMUSCULAR
  Filled 2014-05-31: qty 250

## 2014-05-31 NOTE — Discharge Instructions (Signed)
Safe Sex Tests are pending for sexually transmitted diseases. Specifically you will be called if your test comes back positive for HIV (AIDS) or for syphilis. Hold a warm clean washcloth over the left side of your face 4 times daily for 30 minutes at a time. If you're not improved in a week, see a dermatologist   Safe sex is about reducing the risk of giving or getting a sexually transmitted disease (STD). STDs are spread through sexual contact involving the genitals, mouth, or rectum. Some STDs can be cured and others cannot. Safe sex can also prevent unintended pregnancies.  WHAT ARE SOME SAFE SEX PRACTICES?  Limit your sexual activity to only one partner who is having sex with only you.  Talk to your partner about his or her past partners, past STDs, and drug use.  Use a condom every time you have sexual intercourse. This includes vaginal, oral, and anal sexual activity. Both females and males should wear condoms during oral sex. Only use latex or polyurethane condoms and water-based lubricants. Using petroleum-based lubricants or oils to lubricate a condom will weaken the condom and increase the chance that it will break. The condom should be in place from the beginning to the end of sexual activity. Wearing a condom reduces, but does not completely eliminate, your risk of getting or giving an STD. STDs can be spread by contact with infected body fluids and skin.  Get vaccinated for hepatitis B and HPV.  Avoid alcohol and recreational drugs, which can affect your judgment. You may forget to use a condom or participate in high-risk sex.  For females, avoid douching after sexual intercourse. Douching can spread an infection farther into the reproductive tract.  Check your body for signs of sores, blisters, rashes, or unusual discharge. See your health care provider if you notice any of these signs.  Avoid sexual contact if you have symptoms of an infection or are being treated for an STD. If  you or your partner has herpes, avoid sexual contact when blisters are present. Use condoms at all other times.  If you are at risk of being infected with HIV, it is recommended that you take a prescription medicine daily to prevent HIV infection. This is called pre-exposure prophylaxis (PrEP). You are considered at risk if:  You are a man who has sex with other men (MSM).  You are a heterosexual man or woman who is sexually active with more than one partner.  You take drugs by injection.  You are sexually active with a partner who has HIV.  Talk with your health care provider about whether you are at high risk of being infected with HIV. If you choose to begin PrEP, you should first be tested for HIV. You should then be tested every 3 months for as long as you are taking PrEP.  See your health care provider for regular screenings, exams, and tests for other STDs. Before having sex with a new partner, each of you should be screened for STDs and should talk about the results with each other. WHAT ARE THE BENEFITS OF SAFE SEX?   There is less chance of getting or giving an STD.  You can prevent unwanted or unintended pregnancies.  By discussing safe sex concerns with your partner, you may increase feelings of intimacy, comfort, trust, and honesty between the two of you. Document Released: 07/12/2004 Document Revised: 10/19/2013 Document Reviewed: 11/26/2011 [facility] Patient Information 2015 [place], [place]. This information is not intended to replace advice  given to you by your health care provider. Make sure you discuss any questions you have with your health care provider.

## 2014-05-31 NOTE — ED Provider Notes (Addendum)
[name]: [id number]     Arrival date & time 05/31/14  1300 History   First MD Initiated Contact with Patient 05/31/14 1453     Chief Complaint  Patient presents with  . Facial Pain     (Consider location/radiation/quality/duration/timing/severity/associated sxs/prior Treatment) HPI [place] of "cyst on leftcheek for 1.5 years he states that he's attempted to open it on his own . Pain has spread to his jaw and teeth nose. He is treated himself with ibuprofen, without relief. He also is requesting an STD check. Stating he "slept with someone one year ago." He denies dysuria denies urethral discharge. No rash no fever no other associated symptoms Past Medical History  Diagnosis Date  . Back pain   . Gastric ulcer, chronic   . Depression   . Anxiety   . Chronic pain   . Arthritis   . Bipolar 1 disorder   . Panic attacks   . Substance induced mood disorder 07/19/2012   Past Surgical History  Procedure Laterality Date  . None     No family history on file. History  Substance Use Topics  . Smoking status: Current Every Day Smoker -- 0.00 packs/day for 0 years  . Smokeless tobacco: Never Used  . Alcohol Use: Yes   uses electronic cigarettes positive alcohol use positive marijuana use no other illicit drug use  Review of Systems  Constitutional: Negative.   HENT: Positive for facial swelling.        Facial pain  Respiratory: Negative.   Cardiovascular: Negative.   Gastrointestinal: Negative.   Musculoskeletal: Negative.   Skin: Negative.   Neurological: Negative.   Psychiatric/Behavioral: Negative.   All other systems reviewed and are negative.     Allergies  Benadryl; Nyquil; Tylenol; and Vistaril  Home Medications   Prior to Admission medications   Medication Sig Start Date End Date Taking? Authorizing Provider  ibuprofen (ADVIL,MOTRIN) 800 MG tablet Take 1 tablet (800 mg total) by mouth every 8 (eight) hours as needed for moderate pain. 05/05/14   Audree CamelScott T Goldston, MD   oxyCODONE (ROXICODONE) 5 MG immediate release tablet Take 1 tablet (5 mg total) by mouth every 6 (six) hours as needed for severe pain. 05/05/14   Audree CamelScott T Goldston, MD   BP 119/69 mmHg  Pulse 80  Temp(Src) 98.5 F (36.9 C) (Oral)  Resp 16  Ht 6' (1.829 m)  Wt 183 lb (83.008 kg)  BMI 24.81 kg/m2  SpO2 100% Physical Exam  Constitutional: He appears well-developed and well-nourished.  HENT:  Head: Atraumatic.  Left Ear: External ear normal.  Mouth/Throat: Oropharynx is clear and moist.  2 mm pustule at Center of left cheek suggestive of pimple, minimally tender no surrounding redness swelling or tenderness  Eyes: Conjunctivae are normal. Pupils are equal, round, and reactive to light.  Neck: Neck supple. No thyromegaly present.  Cardiovascular: Normal rate.   Pulmonary/Chest: Effort normal.  Abdominal: Soft. Bowel sounds are normal. He exhibits no distension.  Genitourinary: Penis normal.  Circumcised no urethral discharge no inguinal nodes  Musculoskeletal: Normal range of motion. He exhibits no edema or tenderness.  Lymphadenopathy:    He has no cervical adenopathy.  Neurological: He is alert. Coordination normal.  Skin: Skin is warm and dry. No rash noted.  Psychiatric: He has a normal mood and affect.  Nursing note and vitals reviewed.   ED Course  Procedures (including critical care time) Labs Review Labs Reviewed  URINALYSIS, ROUTINE W REFLEX MICROSCOPIC - Abnormal; Notable for the following:  APPearance TURBID (*)    All other components within normal limits  URINE MICROSCOPIC-ADD ON    Imaging Review No results found.   EKG Interpretation None      MDM  There is no distinct abscess patient's face. Nothing to incise and drain. Patient reports his male companion was treated for sexually transmitted diseases 2 days ago. In light of STD exposure will treat Rocephin and Zithromax. STD panel pending . [place]  Final diagnoses:   None   plan warm compresses to face STD panel pending Dx#1 pimple  #2 std exposure      Doug SouSam Maninder Deboer, MD 05/31/14 1515  Doug SouSam Wana Mount, MD 05/31/14 1525

## 2014-05-31 NOTE — ED Notes (Signed)
Pt reports he has a cyst in the inside of his mouth that is causing pain to his entire left side a face, in his jaw and teeth and nose.  Pt also reports he would like an STD check.

## 2014-06-01 LAB — GC/CHLAMYDIA PROBE AMP
CT Probe RNA: NEGATIVE
GC Probe RNA: NEGATIVE

## 2014-08-14 ENCOUNTER — Encounter (HOSPITAL_COMMUNITY): Payer: Self-pay | Admitting: Emergency Medicine

## 2014-08-14 ENCOUNTER — Emergency Department (HOSPITAL_COMMUNITY)
Admission: EM | Admit: 2014-08-14 | Discharge: 2014-08-14 | Disposition: A | Discharge status: Home / Self Care | Payer: Worker's Compensation | Attending: Emergency Medicine | Admitting: Emergency Medicine

## 2014-08-14 DIAGNOSIS — S0501XA Injury of conjunctiva and corneal abrasion without foreign body, right eye, initial encounter: Secondary | ICD-10-CM | POA: Diagnosis not present

## 2014-08-14 DIAGNOSIS — W228XXA Striking against or struck by other objects, initial encounter: Secondary | ICD-10-CM | POA: Insufficient documentation

## 2014-08-14 DIAGNOSIS — Y998 Other external cause status: Secondary | ICD-10-CM | POA: Insufficient documentation

## 2014-08-14 DIAGNOSIS — G8929 Other chronic pain: Secondary | ICD-10-CM | POA: Diagnosis not present

## 2014-08-14 DIAGNOSIS — Y9389 Activity, other specified: Secondary | ICD-10-CM | POA: Diagnosis not present

## 2014-08-14 DIAGNOSIS — Z720 Tobacco use: Secondary | ICD-10-CM | POA: Diagnosis not present

## 2014-08-14 DIAGNOSIS — M1990 Unspecified osteoarthritis, unspecified site: Secondary | ICD-10-CM | POA: Diagnosis not present

## 2014-08-14 DIAGNOSIS — Z8659 Personal history of other mental and behavioral disorders: Secondary | ICD-10-CM | POA: Insufficient documentation

## 2014-08-14 DIAGNOSIS — Y9289 Other specified places as the place of occurrence of the external cause: Secondary | ICD-10-CM | POA: Diagnosis not present

## 2014-08-14 DIAGNOSIS — S0591XA Unspecified injury of right eye and orbit, initial encounter: Secondary | ICD-10-CM | POA: Diagnosis present

## 2014-08-14 MED ORDER — OXYCODONE HCL 5 MG PO TABS: 5.0000 mg | ORAL_TABLET | Freq: Four times a day (QID) | ORAL | Status: DC | PRN

## 2014-08-14 MED ORDER — IBUPROFEN 800 MG PO TABS
800.0000 mg | ORAL_TABLET | Freq: Once | ORAL | Status: AC
  Administered 2014-08-14: 800 mg via ORAL
  Filled 2014-08-14: qty 1

## 2014-08-14 MED ORDER — FLUORESCEIN SODIUM 1 MG OP STRP
1.0000 | ORAL_STRIP | Freq: Once | OPHTHALMIC | Status: AC
  Administered 2014-08-14: 1 via OPHTHALMIC
  Filled 2014-08-14: qty 1

## 2014-08-14 MED ORDER — POLYMYXIN B-TRIMETHOPRIM 10000-0.1 UNIT/ML-% OP SOLN
1.0000 [drp] | OPHTHALMIC | Status: DC
  Administered 2014-08-14: 1 [drp] via OPHTHALMIC
  Filled 2014-08-14: qty 10

## 2014-08-14 MED ORDER — TETRACAINE HCL 0.5 % OP SOLN
2.0000 [drp] | Freq: Once | OPHTHALMIC | Status: AC
  Administered 2014-08-14: 2 [drp] via OPHTHALMIC
  Filled 2014-08-14: qty 2

## 2014-08-14 MED ORDER — TETANUS-DIPHTH-ACELL PERTUSSIS 5-2.5-18.5 LF-MCG/0.5 IM SUSP
0.5000 mL | Freq: Once | INTRAMUSCULAR | Status: AC
  Administered 2014-08-14: 0.5 mL via INTRAMUSCULAR
  Filled 2014-08-14: qty 0.5

## 2014-08-14 MED ORDER — IBUPROFEN 800 MG PO TABS: 800.0000 mg | ORAL_TABLET | Freq: Three times a day (TID) | ORAL | Status: DC

## 2014-08-14 NOTE — ED Provider Notes (Signed)
[name]: [id number]     Arrival date & time 08/14/14  1455 History  This chart was scribed for non-physician practitioner Jinny SandersJoseph Sylwia Cuervo, PA-C working with No att. providers found by Conchita ParisNadim Abuhashem, ED Scribe. This patient was seen in [facility] and [facility] patient's care was started at 4:27 PM.    Chief Complaint  Patient presents with  . Eye Pain   Patient is a 34 y.o. male presenting with eye pain. [facility] history is provided by Casey patient. No language interpreter was used.  Eye Pain Associated symptoms include headaches.    HPI Comments: Casey OfferJason A Mercer is a 34 y.o. male who presents to [facility] complaining of sharp right eye pain. Pt was working and a branch hit him in Casey eye. Pt has a HA as associated symptom. He was seen in an urgent care and his eye was stained under viewed under a fluorescent light. Pt was told he had an abrasion to his right eye and told to come to [facility]. He denies any loss in visual acuity.    Past Medical History  Diagnosis Date  . Back pain   . Gastric ulcer, chronic   . Depression   . Anxiety   . Chronic pain   . Arthritis   . Bipolar 1 disorder   . Panic attacks   . Substance induced mood disorder 07/19/2012   Past Surgical History  Procedure Laterality Date  . Casey Mercer     No family history on file. History  Substance Use Topics  . Smoking status: Current Every Day Smoker -- 0.00 packs/day for 0 years  . Smokeless tobacco: Never Used  . Alcohol Use: Yes    Review of Systems  Eyes: Positive for pain. Negative for visual disturbance.  Neurological: Positive for headaches.    Allergies  Benadryl; Nyquil; Tylenol; and Vistaril  Home Medications   Prior to Admission medications   Medication Sig Start Date End Date Taking? Authorizing Provider  ibuprofen (ADVIL,MOTRIN) 800 MG tablet Take 1 tablet (800 mg total) by mouth every 8 (eight) hours as needed for moderate pain. 05/05/14   Audree CamelScott T Goldston, MD  ibuprofen (ADVIL,MOTRIN) 800 MG tablet  Take 1 tablet (800 mg total) by mouth 3 (three) Mercer daily. 08/14/14   Monte FantasiaJoseph W Derik Fults, PA-C  oxyCODONE (ROXICODONE) 5 MG immediate release tablet Take 1 tablet (5 mg total) by mouth every 6 (six) hours as needed for severe pain. 05/05/14   Audree CamelScott T Goldston, MD  oxyCODONE (ROXICODONE) 5 MG immediate release tablet Take 1 tablet (5 mg total) by mouth every 6 (six) hours as needed for severe pain. 08/14/14   Monte FantasiaJoseph W Lavana Huckeba, PA-C   BP 133/81 mmHg  Pulse 76  Temp(Src) 98.3 F (36.8 C)  Resp 20  SpO2 100% Physical Exam  Constitutional: He is oriented to person, place, and time. He appears well-developed and well-nourished.  HENT:  Head: Normocephalic and atraumatic.  Eyes: Conjunctivae, EOM and lids are normal. Pupils are equal, round, and reactive to light. Lids are everted and swept, no foreign bodies found. Right eye exhibits no chemosis. Left eye exhibits no chemosis. Right conjunctiva is not injected. Right conjunctiva has no hemorrhage. Left conjunctiva is not injected. Left conjunctiva has no hemorrhage. Right eye exhibits normal extraocular motion. Left eye exhibits normal extraocular motion.  Slit lamp exam:      Casey right eye shows no corneal abrasion, no corneal ulcer, no foreign body, no hyphema, no hypopyon, no fluorescein uptake and no anterior  chamber bulge.       Casey left eye shows no corneal abrasion, no corneal ulcer, no foreign body, no hyphema, no hypopyon, no fluorescein uptake and no anterior chamber bulge.  Neck: Normal range of motion.  Cardiovascular: Normal rate.   Pulmonary/Chest: Effort normal.  Musculoskeletal: Normal range of motion.  Neurological: He is alert and oriented to person, place, and time.  Skin: Skin is warm and dry.  Psychiatric: He has a normal mood and affect. His behavior is normal.  Nursing note and vitals reviewed.   ED Course  Procedures  DIAGNOSTIC STUDIES: Oxygen Saturation is 98% on room air, normal by my interpretation.    COORDINATION OF  CARE: 4:32 PM Discussed treatment plan with pt at bedside and pt agreed to plan.  Labs Review Labs Reviewed - No data to display  Imaging Review No results found.   EKG Interpretation Casey Mercer      MDM   Final diagnoses:  Corneal abrasion, right, initial encounter    Pt without obvious corneal abrasion on PE, however with patient's amount of discomfort and his history, likely patient does have small corneal abrasion.. Tdap given. Eye irrigated w NS, no evidence of FB.  No change in vision, acuity equal bilaterally.  Pt is not a contact lens wearer.  Exam non-concerning for orbital cellulitis, hyphema, corneal ulcers. Patient will be discharged home with polymyxin.   Patient understands to follow up with ophthalmology, & to return to ER if new symptoms develop including change in vision, purulent drainage, or entrapment.  I personally performed [facility] services described in this documentation, which was scribed in my presence. Casey recorded information has been reviewed and is accurate.  BP 133/81 mmHg  Pulse 76  Temp(Src) 98.3 F (36.8 C)  Resp 20  SpO2 100%  Signed,  Ladona Mow, PA-C 10:01 PM       Monte Fantasia, PA-C 08/14/14 2201  Candyce Churn III, MD 08/16/14 [phone]

## 2014-08-14 NOTE — Discharge Instructions (Signed)
Corneal Abrasion °The cornea is the clear covering at the front and center of the eye. When looking at the colored portion of the eye (iris), you are looking through the cornea. This very thin tissue is made up of many layers. The surface layer is a single layer of cells (corneal epithelium) and is one of the most sensitive tissues in the body. If a scratch or injury causes the corneal epithelium to come off, it is called a corneal abrasion. If the injury extends to the tissues below the epithelium, the condition is called a corneal ulcer. °CAUSES  °· Scratches. °· Trauma. °· Foreign body in the eye. °Some people have recurrences of abrasions in the area of the original injury even after it has healed (recurrent erosion syndrome). Recurrent erosion syndrome generally improves and goes away with time. °SYMPTOMS  °· Eye pain. °· Difficulty or inability to keep the injured eye open. °· The eye becomes very sensitive to light. °· Recurrent erosions tend to happen suddenly, first thing in the morning, usually after waking up and opening the eye. °DIAGNOSIS  °Your health care provider can diagnose a corneal abrasion during an eye exam. Dye is usually placed in the eye using a drop or a small paper strip moistened by your tears. When the eye is examined with a special light, the abrasion shows up clearly because of the dye. °TREATMENT  °· Small abrasions may be treated with antibiotic drops or ointment alone. °· A pressure patch may be put over the eye. If this is done, follow your doctor's instructions for when to remove the patch. Do not drive or use machines while the eye patch is on. Judging distances is hard to do with a patch on. °If the abrasion becomes infected and spreads to the deeper tissues of the cornea, a corneal ulcer can result. This is serious because it can cause corneal scarring. Corneal scars interfere with light passing through the cornea and cause a loss of vision in the involved eye. °HOME CARE  INSTRUCTIONS °· Use medicine or ointment as directed. Only take over-the-counter or prescription medicines for pain, discomfort, or fever as directed by your health care provider. °· Do not drive or operate machinery if your eye is patched. Your ability to judge distances is impaired. °· If your health care provider has given you a follow-up appointment, it is very important to keep that appointment. Not keeping the appointment could result in a severe eye infection or permanent loss of vision. If there is any problem keeping the appointment, let your health care provider know. °SEEK MEDICAL CARE IF:  °· You have pain, light sensitivity, and a scratchy feeling in one eye or both eyes. °· Your pressure patch keeps loosening up, and you can blink your eye under the patch after treatment. °· Any kind of discharge develops from the eye after treatment or if the lids stick together in the morning. °· You have the same symptoms in the morning as you did with the original abrasion days, weeks, or months after the abrasion healed. °MAKE SURE YOU:  °· Understand these instructions. °· Will watch your condition. °· Will get help right away if you are not doing well or get worse. °Document Released: 06/01/2000 Document Revised: 06/09/2013 Document Reviewed: 02/09/2013 °ExitCare® Patient Information ©2015 [employer]. This information is not intended to replace advice given to you by your health care provider. Make sure you discuss any questions you have with your health care provider. ° °

## 2014-08-14 NOTE — ED Notes (Signed)
Pt states he was sent here by [facility].  Poked in the eye with a stick at work.  Rt eye.

## 2015-01-07 ENCOUNTER — Ambulatory Visit (INDEPENDENT_AMBULATORY_CARE_PROVIDER_SITE_OTHER): Payer: Worker's Compensation | Admitting: Family Medicine

## 2015-01-07 ENCOUNTER — Ambulatory Visit: Payer: Worker's Compensation

## 2015-01-07 VITALS — BP 116/74 | HR 70 | Temp 98.0°F | Resp 18 | Ht 72.0 in | Wt 178.0 lb

## 2015-01-07 DIAGNOSIS — S300XXA Contusion of lower back and pelvis, initial encounter: Secondary | ICD-10-CM

## 2015-01-07 DIAGNOSIS — S39012A Strain of muscle, fascia and tendon of lower back, initial encounter: Secondary | ICD-10-CM

## 2015-01-07 DIAGNOSIS — M545 Low back pain: Secondary | ICD-10-CM | POA: Diagnosis not present

## 2015-01-07 MED ORDER — MELOXICAM 7.5 MG PO TABS: 7.5000 mg | ORAL_TABLET | Freq: Every day | ORAL | Status: AC

## 2015-01-07 MED ORDER — CYCLOBENZAPRINE HCL 5 MG PO TABS: ORAL_TABLET | ORAL | Status: AC

## 2015-01-07 NOTE — Patient Instructions (Signed)
You likely have a sprained ligament or strained muscle in the low back, which can lead to some muscle spasm as well. Try the mobic each morning (do not combine with other over the counter pain relievers), flexeril at night if needed.  Heat or ice to area as needed.   If any weakness in legs, loss of control of bowel or bladder function, or loss of feeling in the genital area , return right away to the clinic or the emergency room. Otherwise follow-up Tuesday of next week (4 days).   Return to the clinic or go to the nearest emergency room if any of your symptoms worsen or new symptoms occur.  Low Back Strain with Rehab A strain is an injury in which a tendon or muscle is torn. The muscles and tendons of the lower back are vulnerable to strains. However, these muscles and tendons are very strong and require a great force to be injured. Strains are classified into three categories. Grade 1 strains cause pain, but the tendon is not lengthened. Grade 2 strains include a lengthened ligament, due to the ligament being stretched or partially ruptured. With grade 2 strains there is still function, although the function may be decreased. Grade 3 strains involve a complete tear of the tendon or muscle, and function is usually impaired. SYMPTOMS   Pain in the lower back.  Pain that affects one side more than the other.  Pain that gets worse with movement and may be felt in the hip, buttocks, or back of the thigh.  Muscle spasms of the muscles in the back.  Swelling along the muscles of the back.  Loss of strength of the back muscles.  Crackling sound (crepitation) when the muscles are touched. CAUSES  Lower back strains occur when a force is placed on the muscles or tendons that is greater than they can handle. Common causes of injury include:  Prolonged overuse of the muscle-tendon units in the lower back, usually from incorrect posture.  A single violent injury or force applied to the back. RISK  INCREASES WITH:  Sports that involve twisting forces on the spine or a lot of bending at the waist (football, rugby, weightlifting, bowling, golf, tennis, speed skating, racquetball, swimming, running, gymnastics, diving).  Poor strength and flexibility.  Failure to warm up properly before activity.  Family history of lower back pain or disk disorders.  Previous back injury or surgery (especially fusion).  Poor posture with lifting, especially heavy objects.  Prolonged sitting, especially with poor posture. PREVENTION   Learn and use proper posture when sitting or lifting (maintain proper posture when sitting, lift using the knees and legs, not at the waist).  Warm up and stretch properly before activity.  Allow for adequate recovery between workouts.  Maintain physical fitness:  Strength, flexibility, and endurance.  Cardiovascular fitness. PROGNOSIS  If treated properly, lower back strains usually heal within 6 weeks. RELATED COMPLICATIONS   Recurring symptoms, resulting in a chronic problem.  Chronic inflammation, scarring, and partial muscle-tendon tear.  Delayed healing or resolution of symptoms.  Prolonged disability. TREATMENT  Treatment first involves the use of ice and medicine, to reduce pain and inflammation. The use of strengthening and stretching exercises may help reduce pain with activity. These exercises may be performed at home or with a therapist. Severe injuries may require referral to a therapist for further evaluation and treatment, such as ultrasound. Your caregiver may advise that you wear a back brace or corset, to help reduce pain  and discomfort. Often, prolonged bed rest results in greater harm then benefit. Corticosteroid injections may be recommended. However, these should be reserved for the most serious cases. It is important to avoid using your back when lifting objects. At night, sleep on your back on a firm mattress with a pillow placed under  your knees. If non-surgical treatment is unsuccessful, surgery may be needed.  MEDICATION   If pain medicine is needed, nonsteroidal anti-inflammatory medicines (aspirin and ibuprofen), or other minor pain relievers (acetaminophen), are often advised.  Do not take pain medicine for 7 days before surgery.  Prescription pain relievers may be given, if your caregiver thinks they are needed. Use only as directed and only as much as you need.  Ointments applied to the skin may be helpful.  Corticosteroid injections may be given by your caregiver. These injections should be reserved for the most serious cases, because they may only be given a certain number of times. HEAT AND COLD  Cold treatment (icing) should be applied for 10 to 15 minutes every 2 to 3 hours for inflammation and pain, and immediately after activity that aggravates your symptoms. Use ice packs or an ice massage.  Heat treatment may be used before performing stretching and strengthening activities prescribed by your caregiver, physical therapist, or athletic trainer. Use a heat pack or a warm water soak. SEEK MEDICAL CARE IF:   Symptoms get worse or do not improve in 2 to 4 weeks, despite treatment.  You develop numbness, weakness, or loss of bowel or bladder function.  New, unexplained symptoms develop. (Drugs used in treatment may produce side effects.) EXERCISES  RANGE OF MOTION (ROM) AND STRETCHING EXERCISES - Low Back Strain Most people with lower back pain will find that their symptoms get worse with excessive bending forward (flexion) or arching at the lower back (extension). The exercises which will help resolve your symptoms will focus on the opposite motion.  Your physician, physical therapist or athletic trainer will help you determine which exercises will be most helpful to resolve your lower back pain. Do not complete any exercises without first consulting with your caregiver. Discontinue any exercises which make  your symptoms worse until you speak to your caregiver.  If you have pain, numbness or tingling which travels down into your buttocks, leg or foot, the goal of the therapy is for these symptoms to move closer to your back and eventually resolve. Sometimes, these leg symptoms will get better, but your lower back pain may worsen. This is typically an indication of progress in your rehabilitation. Be very alert to any changes in your symptoms and the activities in which you participated in the 24 hours prior to the change. Sharing this information with your caregiver will allow him/her to most efficiently treat your condition.  These exercises may help you when beginning to rehabilitate your injury. Your symptoms may resolve with or without further involvement from your physician, physical therapist or athletic trainer. While completing these exercises, remember:  Restoring tissue flexibility helps normal motion to return to the joints. This allows healthier, less painful movement and activity.  An effective stretch should be held for at least 30 seconds.  A stretch should never be painful. You should only feel a gentle lengthening or release in the stretched tissue. FLEXION RANGE OF MOTION AND STRETCHING EXERCISES: STRETCH - Flexion, Single Knee to Chest   Lie on a firm bed or floor with both legs extended in front of you.  Keeping one leg in  contact with the floor, bring your opposite knee to your chest. Hold your leg in place by either grabbing behind your thigh or at your knee.  Pull until you feel a gentle stretch in your lower back. Hold __________ seconds.  Slowly release your grasp and repeat the exercise with the opposite side. Repeat __________ times. Complete this exercise __________ times per day.  STRETCH - Flexion, Double Knee to Chest   Lie on a firm bed or floor with both legs extended in front of you.  Keeping one leg in contact with the floor, bring your opposite knee to your  chest.  Tense your stomach muscles to support your back and then lift your other knee to your chest. Hold your legs in place by either grabbing behind your thighs or at your knees.  Pull both knees toward your chest until you feel a gentle stretch in your lower back. Hold __________ seconds.  Tense your stomach muscles and slowly return one leg at a time to the floor. Repeat __________ times. Complete this exercise __________ times per day.  STRETCH - Low Trunk Rotation  Lie on a firm bed or floor. Keeping your legs in front of you, bend your knees so they are both pointed toward the ceiling and your feet are flat on the floor.  Extend your arms out to the side. This will stabilize your upper body by keeping your shoulders in contact with the floor.  Gently and slowly drop both knees together to one side until you feel a gentle stretch in your lower back. Hold for __________ seconds.  Tense your stomach muscles to support your lower back as you bring your knees back to the starting position. Repeat the exercise to the other side. Repeat __________ times. Complete this exercise __________ times per day  EXTENSION RANGE OF MOTION AND FLEXIBILITY EXERCISES: STRETCH - Extension, Prone on Elbows   Lie on your stomach on the floor, a bed will be too soft. Place your palms about shoulder width apart and at the height of your head.  Place your elbows under your shoulders. If this is too painful, stack pillows under your chest.  Allow your body to relax so that your hips drop lower and make contact more completely with the floor.  Hold this position for __________ seconds.  Slowly return to lying flat on the floor. Repeat __________ times. Complete this exercise __________ times per day.  RANGE OF MOTION - Extension, Prone Press Ups  Lie on your stomach on the floor, a bed will be too soft. Place your palms about shoulder width apart and at the height of your head.  Keeping your back as  relaxed as possible, slowly straighten your elbows while keeping your hips on the floor. You may adjust the placement of your hands to maximize your comfort. As you gain motion, your hands will come more underneath your shoulders.  Hold this position __________ seconds.  Slowly return to lying flat on the floor. Repeat __________ times. Complete this exercise __________ times per day.  RANGE OF MOTION- Quadruped, Neutral Spine   Assume a hands and knees position on a firm surface. Keep your hands under your shoulders and your knees under your hips. You may place padding under your knees for comfort.  Drop your head and point your tail bone toward the ground below you. This will round out your lower back like an angry cat. Hold this position for __________ seconds.  Slowly lift your head and release  your tail bone so that your back sags into a large arch, like an old horse.  Hold this position for __________ seconds.  Repeat this until you feel limber in your lower back.  Now, find your "sweet spot." This will be the most comfortable position somewhere between the two previous positions. This is your neutral spine. Once you have found this position, tense your stomach muscles to support your lower back.  Hold this position for __________ seconds. Repeat __________ times. Complete this exercise __________ times per day.  STRENGTHENING EXERCISES - Low Back Strain These exercises may help you when beginning to rehabilitate your injury. These exercises should be done near your "sweet spot." This is the neutral, low-back arch, somewhere between fully rounded and fully arched, that is your least painful position. When performed in this safe range of motion, these exercises can be used for people who have either a flexion or extension based injury. These exercises may resolve your symptoms with or without further involvement from your physician, physical therapist or athletic trainer. While completing  these exercises, remember:   Muscles can gain both the endurance and the strength needed for everyday activities through controlled exercises.  Complete these exercises as instructed by your physician, physical therapist or athletic trainer. Increase the resistance and repetitions only as guided.  You may experience muscle soreness or fatigue, but the pain or discomfort you are trying to eliminate should never worsen during these exercises. If this pain does worsen, stop and make certain you are following the directions exactly. If the pain is still present after adjustments, discontinue the exercise until you can discuss the trouble with your caregiver. STRENGTHENING - Deep Abdominals, Pelvic Tilt  Lie on a firm bed or floor. Keeping your legs in front of you, bend your knees so they are both pointed toward the ceiling and your feet are flat on the floor.  Tense your lower abdominal muscles to press your lower back into the floor. This motion will rotate your pelvis so that your tail bone is scooping upwards rather than pointing at your feet or into the floor.  With a gentle tension and even breathing, hold this position for __________ seconds. Repeat __________ times. Complete this exercise __________ times per day.  STRENGTHENING - Abdominals, Crunches   Lie on a firm bed or floor. Keeping your legs in front of you, bend your knees so they are both pointed toward the ceiling and your feet are flat on the floor. Cross your arms over your chest.  Slightly tip your chin down without bending your neck.  Tense your abdominals and slowly lift your trunk high enough to just clear your shoulder blades. Lifting higher can put excessive stress on the lower back and does not further strengthen your abdominal muscles.  Control your return to the starting position. Repeat __________ times. Complete this exercise __________ times per day.  STRENGTHENING - Quadruped, Opposite UE/LE Lift   Assume a  hands and knees position on a firm surface. Keep your hands under your shoulders and your knees under your hips. You may place padding under your knees for comfort.  Find your neutral spine and gently tense your abdominal muscles so that you can maintain this position. Your shoulders and hips should form a rectangle that is parallel with the floor and is not twisted.  Keeping your trunk steady, lift your right hand no higher than your shoulder and then your left leg no higher than your hip. Make sure you are  not holding your breath. Hold this position __________ seconds.  Continuing to keep your abdominal muscles tense and your back steady, slowly return to your starting position. Repeat with the opposite arm and leg. Repeat __________ times. Complete this exercise __________ times per day.  STRENGTHENING - Lower Abdominals, Double Knee Lift  Lie on a firm bed or floor. Keeping your legs in front of you, bend your knees so they are both pointed toward the ceiling and your feet are flat on the floor.  Tense your abdominal muscles to brace your lower back and slowly lift both of your knees until they come over your hips. Be certain not to hold your breath.  Hold __________ seconds. Using your abdominal muscles, return to the starting position in a slow and controlled manner. Repeat __________ times. Complete this exercise __________ times per day.  POSTURE AND BODY MECHANICS CONSIDERATIONS - Low Back Strain Keeping correct posture when sitting, standing or completing your activities will reduce the stress put on different body tissues, allowing injured tissues a chance to heal and limiting painful experiences. The following are general guidelines for improved posture. Your physician or physical therapist will provide you with any instructions specific to your needs. While reading these guidelines, remember:  The exercises prescribed by your provider will help you have the flexibility and strength to  maintain correct postures.  The correct posture provides the best environment for your joints to work. All of your joints have less wear and tear when properly supported by a spine with good posture. This means you will experience a healthier, less painful body.  Correct posture must be practiced with all of your activities, especially prolonged sitting and standing. Correct posture is as important when doing repetitive low-stress activities (typing) as it is when doing a single heavy-load activity (lifting). RESTING POSITIONS Consider which positions are most painful for you when choosing a resting position. If you have pain with flexion-based activities (sitting, bending, stooping, squatting), choose a position that allows you to rest in a less flexed posture. You would want to avoid curling into a fetal position on your side. If your pain worsens with extension-based activities (prolonged standing, working overhead), avoid resting in an extended position such as sleeping on your stomach. Most people will find more comfort when they rest with their spine in a more neutral position, neither too rounded nor too arched. Lying on a non-sagging bed on your side with a pillow between your knees, or on your back with a pillow under your knees will often provide some relief. Keep in mind, being in any one position for a prolonged period of time, no matter how correct your posture, can still lead to stiffness. PROPER SITTING POSTURE In order to minimize stress and discomfort on your spine, you must sit with correct posture. Sitting with good posture should be effortless for a healthy body. Returning to good posture is a gradual process. Many people can work toward this most comfortably by using various supports until they have the flexibility and strength to maintain this posture on their own. When sitting with proper posture, your ears will fall over your shoulders and your shoulders will fall over your hips. You  should use the back of the chair to support your upper back. Your lower back will be in a neutral position, just slightly arched. You may place a small pillow or folded towel at the base of your lower back for support.  When working at a desk, create an environment  that supports good, upright posture. Without extra support, muscles tire, which leads to excessive strain on joints and other tissues. Keep these recommendations in mind: CHAIR:  A chair should be able to slide under your desk when your back makes contact with the back of the chair. This allows you to work closely.  The chair's height should allow your eyes to be level with the upper part of your monitor and your hands to be slightly lower than your elbows. BODY POSITION  Your feet should make contact with the floor. If this is not possible, use a foot rest.  Keep your ears over your shoulders. This will reduce stress on your neck and lower back. INCORRECT SITTING POSTURES  If you are feeling tired and unable to assume a healthy sitting posture, do not slouch or slump. This puts excessive strain on your back tissues, causing more damage and pain. Healthier options include:  Using more support, like a lumbar pillow.  Switching tasks to something that requires you to be upright or walking.  Talking a brief walk.  Lying down to rest in a neutral-spine position. PROLONGED STANDING WHILE SLIGHTLY LEANING FORWARD  When completing a task that requires you to lean forward while standing in one place for a long time, place either foot up on a stationary 2-4 inch high object to help maintain the best posture. When both feet are on the ground, the lower back tends to lose its slight inward curve. If this curve flattens (or becomes too large), then the back and your other joints will experience too much stress, tire more quickly, and can cause pain. CORRECT STANDING POSTURES Proper standing posture should be assumed with all daily activities,  even if they only take a few moments, like when brushing your teeth. As in sitting, your ears should fall over your shoulders and your shoulders should fall over your hips. You should keep a slight tension in your abdominal muscles to brace your spine. Your tailbone should point down to the ground, not behind your body, resulting in an over-extended swayback posture.  INCORRECT STANDING POSTURES  Common incorrect standing postures include a forward head, locked knees and/or an excessive swayback. WALKING Walk with an upright posture. Your ears, shoulders and hips should all line-up. PROLONGED ACTIVITY IN A FLEXED POSITION When completing a task that requires you to bend forward at your waist or lean over a low surface, try to find a way to stabilize 3 out of 4 of your limbs. You can place a hand or elbow on your thigh or rest a knee on the surface you are reaching across. This will provide you more stability so that your muscles do not fatigue as quickly. By keeping your knees relaxed, or slightly bent, you will also reduce stress across your lower back. CORRECT LIFTING TECHNIQUES DO :   Assume a wide stance. This will provide you more stability and the opportunity to get as close as possible to the object which you are lifting.  Tense your abdominals to brace your spine. Bend at the knees and hips. Keeping your back locked in a neutral-spine position, lift using your leg muscles. Lift with your legs, keeping your back straight.  Test the weight of unknown objects before attempting to lift them.  Try to keep your elbows locked down at your sides in order get the best strength from your shoulders when carrying an object.  Always ask for help when lifting heavy or awkward objects. INCORRECT LIFTING TECHNIQUES DO  NOT:   Lock your knees when lifting, even if it is a small object.  Bend and twist. Pivot at your feet or move your feet when needing to change directions.  Assume that you can safely  pick up even a paper clip without proper posture. Document Released: 06/04/2005 Document Revised: 08/27/2011 Document Reviewed: 09/16/2008 [facility] Patient Information 2015 [place], [place]. This information is not intended to replace advice given to you by your health care provider. Make sure you discuss any questions you have with your health care provider.

## 2015-01-07 NOTE — Progress Notes (Signed)
Subjective:  This chart was scribed for Meredith Staggers, MD by [employer], medical scribe at [facility].The patient was seen in exam room 04 and the patient's care was started at 4:13 PM.   Patient ID: Casey Mercer, male    DOB: 12/26/1980, 34 y.o.   MRN: [id number] Chief Complaint  Patient presents with  . Back Injury    injured today   . Back Pain   HPI HPI Comments: Casey Mercer is a 34 y.o. male who presents to [facility] because of a back injury which occurred today while at work. He was lifting a 100 pound gate and it fell. He felt immediate pain in his lower back towards the anus and down the inside of both legs to his feet. He is able to bear weight but bending over causes him pain. Sitting down is difficult and reproduces pain. He has taken 600 mg ibuprofen two hours ago and iced in office. Icing was helpful. He denies urine and bowel incontinence, saddle anesthesia. He cuts grass for new garden.  He does have a history for back pain and had trigger point injections at [facility] over two years ago. He has some pain at night and must sleep a certain way at night. By they end of the day his back is very sensitive.   Current Outpatient Prescriptions on File Prior to Visit  Medication Sig Dispense Refill  . ibuprofen (ADVIL,MOTRIN) 800 MG tablet Take 1 tablet (800 mg total) by mouth every 8 (eight) hours as needed for moderate pain. 21 tablet 0  . ibuprofen (ADVIL,MOTRIN) 800 MG tablet Take 1 tablet (800 mg total) by mouth 3 (three) times daily. (Patient not taking: Reported on 01/07/2015) 21 tablet 0  . oxyCODONE (ROXICODONE) 5 MG immediate release tablet Take 1 tablet (5 mg total) by mouth every 6 (six) hours as needed for severe pain. (Patient not taking: Reported on 01/07/2015) 20 tablet 0  . oxyCODONE (ROXICODONE) 5 MG immediate release tablet Take 1 tablet (5 mg total) by mouth every 6 (six) hours as needed for severe pain. (Patient not  taking: Reported on 01/07/2015) 10 tablet 0   No current facility-administered medications on file prior to visit.   Allergies  Allergen Reactions  . Benadryl [Diphenhydramine Hcl] Swelling and Other (See Comments)    Heart races, and causes hyperactivity   . Nyquil [Pseudoeph-Doxylamine-Dm-Apap]   . Tylenol [Acetaminophen] Swelling  . Vistaril [Hydroxyzine Hcl]    Review of Systems  Constitutional: Negative for fever and chills.  Musculoskeletal: Positive for back pain and gait problem.  Neurological: Negative for weakness and numbness.      Objective:  BP 116/74 mmHg  Pulse 70  Temp(Src) 98 F (36.7 C) (Oral)  Resp 18  Ht 6' (1.829 m)  Wt 178 lb (80.74 kg)  BMI 24.14 kg/m2  SpO2 98% Physical Exam  Constitutional: He is oriented to person, place, and time. He appears well-developed and well-nourished. No distress.  HENT:  Head: Normocephalic and atraumatic.  Eyes: Pupils are equal, round, and reactive to light.  Neck: Normal range of motion.  Cardiovascular: Normal rate and regular rhythm.   Pulmonary/Chest: Effort normal. No respiratory distress.  Musculoskeletal: Normal range of motion.  Tender approximately L4-L5 midline and slightly at the upper sacrum. Tender to the right SI joint, most tender at the right sciatic notch. Skin is intact no ecchymosis or erythema. Able to heel and toe walk without difficulty.  Guarded with ROM minimal flexion approximately 10 degree, pain and guarding with right lateral flexion. Rotation is intact. Negative seated straight leg raise.  Neurological: He is alert and oriented to person, place, and time. He displays no Babinski's sign on the right side. He displays no Babinski's sign on the left side.  Reflex Scores:      Achilles reflexes are 1+ on the right side and 1+ on the left side. Difficulty obtaining patellar reflexes but equal bilaterally.  Skin: Skin is warm and dry.  Psychiatric: He has a normal mood and affect. His behavior is  normal.  Nursing note and vitals reviewed. [facility] reading (PRIMARY) by Dr. Neva Seat- Decreased lordosis, minimal spurring of L2 anterior but disc spaces appear preserved and no apparent listhesis.     Assessment & Plan:   Casey Mercer is a 34 y.o. male Low back pain, unspecified back pain laterality, with sciatica presence unspecified - Plan: DG Lumbar Spine Complete  Contusion of lower back, initial encounter - Plan: DG Lumbar Spine Complete  Low back strain, initial encounter - Plan: DG Lumbar Spine Complete, cyclobenzaprine (FLEXERIL) 5 MG tablet, meloxicam (MOBIC) 7.5 MG tablet  Suspected lumbar strain due to injury at work, with possible sciatic symptoms into bilateral legs.  No weakness appreciated on exam, reflexes equal (although difficult to appreciate at patella these were equal bilaterally). History of low back pain and past but had been at normal function prior to injury. No acute findings on back x-ray. Differential diagnosis includes HNP  -Treat initially with Mobic 7.5 mg 1-2 daily, and Flexeril 5 mg (side effects discussed- he has taken this prior with sedation. 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  -Recheck in 4 days, sooner if worse and ER precautions discussed  Meds ordered this encounter  Medications  . cyclobenzaprine (FLEXERIL) 5 MG tablet    Sig: 1/2 to 1 tablet by mouth up to every 8 hours as needed. Start at bedtime as needed due to sedation    Dispense:  15 tablet    Refill:  0  . meloxicam (MOBIC) 7.5 MG tablet    Sig: Take 1-2 tablets (7.5-15 mg total) by mouth daily.    Dispense:  30 tablet    Refill:  0   Patient Instructions  You likely have a sprained ligament or strained muscle in the low back, which can lead to some muscle spasm as well. Try the mobic each morning (do not combine with other over the counter pain relievers), flexeril at  night if needed.  Heat or ice to area as needed.   If any weakness in legs, loss of control of bowel or bladder function, or loss of feeling in the genital area , return right away to the clinic or the emergency room. Otherwise follow-up Tuesday of next week (4 days).   Return to the clinic or go to the nearest emergency room if any of your symptoms worsen or new symptoms occur.  Low Back Strain with Rehab A strain is an injury in which a tendon or muscle is torn. The muscles and tendons of the lower back are vulnerable to strains. However, these muscles and tendons are very strong and require a great force to be injured. Strains are classified into three categories. Grade 1 strains cause pain, but the tendon is not lengthened. Grade 2 strains include a lengthened ligament, due  to the ligament being stretched or partially ruptured. With grade 2 strains there is still function, although the function may be decreased. Grade 3 strains involve a complete tear of the tendon or muscle, and function is usually impaired. SYMPTOMS   Pain in the lower back.  Pain that affects one side more than the other.  Pain that gets worse with movement and may be felt in the hip, buttocks, or back of the thigh.  Muscle spasms of the muscles in the back.  Swelling along the muscles of the back.  Loss of strength of the back muscles.  Crackling sound (crepitation) when the muscles are touched. CAUSES  Lower back strains occur when a force is placed on the muscles or tendons that is greater than they can handle. Common causes of injury include:  Prolonged overuse of the muscle-tendon units in the lower back, usually from incorrect posture.  A single violent injury or force applied to the back. RISK INCREASES WITH:  Sports that involve twisting forces on the spine or a lot of bending at the waist (football, rugby, weightlifting, bowling, golf, tennis, speed skating, racquetball, swimming, running, gymnastics,  diving).  Poor strength and flexibility.  Failure to warm up properly before activity.  Family history of lower back pain or disk disorders.  Previous back injury or surgery (especially fusion).  Poor posture with lifting, especially heavy objects.  Prolonged sitting, especially with poor posture. PREVENTION   Learn and use proper posture when sitting or lifting (maintain proper posture when sitting, lift using the knees and legs, not at the waist).  Warm up and stretch properly before activity.  Allow for adequate recovery between workouts.  Maintain physical fitness:  Strength, flexibility, and endurance.  Cardiovascular fitness. PROGNOSIS  If treated properly, lower back strains usually heal within 6 weeks. RELATED COMPLICATIONS   Recurring symptoms, resulting in a chronic problem.  Chronic inflammation, scarring, and partial muscle-tendon tear.  Delayed healing or resolution of symptoms.  Prolonged disability. TREATMENT  Treatment first involves the use of ice and medicine, to reduce pain and inflammation. The use of strengthening and stretching exercises may help reduce pain with activity. These exercises may be performed at home or with a therapist. Severe injuries may require referral to a therapist for further evaluation and treatment, such as ultrasound. Your caregiver may advise that you wear a back brace or corset, to help reduce pain and discomfort. Often, prolonged bed rest results in greater harm then benefit. Corticosteroid injections may be recommended. However, these should be reserved for the most serious cases. It is important to avoid using your back when lifting objects. At night, sleep on your back on a firm mattress with a pillow placed under your knees. If non-surgical treatment is unsuccessful, surgery may be needed.  MEDICATION   If pain medicine is needed, nonsteroidal anti-inflammatory medicines (aspirin and ibuprofen), or other minor pain  relievers (acetaminophen), are often advised.  Do not take pain medicine for 7 days before surgery.  Prescription pain relievers may be given, if your caregiver thinks they are needed. Use only as directed and only as much as you need.  Ointments applied to the skin may be helpful.  Corticosteroid injections may be given by your caregiver. These injections should be reserved for the most serious cases, because they may only be given a certain number of times. HEAT AND COLD  Cold treatment (icing) should be applied for 10 to 15 minutes every 2 to 3 hours for inflammation and  pain, and immediately after activity that aggravates your symptoms. Use ice packs or an ice massage.  Heat treatment may be used before performing stretching and strengthening activities prescribed by your caregiver, physical therapist, or athletic trainer. Use a heat pack or a warm water soak. SEEK MEDICAL CARE IF:   Symptoms get worse or do not improve in 2 to 4 weeks, despite treatment.  You develop numbness, weakness, or loss of bowel or bladder function.  New, unexplained symptoms develop. (Drugs used in treatment may produce side effects.) EXERCISES  RANGE OF MOTION (ROM) AND STRETCHING EXERCISES - Low Back Strain Most people with lower back pain will find that their symptoms get worse with excessive bending forward (flexion) or arching at the lower back (extension). The exercises which will help resolve your symptoms will focus on the opposite motion.  Your physician, physical therapist or athletic trainer will help you determine which exercises will be most helpful to resolve your lower back pain. Do not complete any exercises without first consulting with your caregiver. Discontinue any exercises which make your symptoms worse until you speak to your caregiver.  If you have pain, numbness or tingling which travels down into your buttocks, leg or foot, the goal of the therapy is for these symptoms to move closer  to your back and eventually resolve. Sometimes, these leg symptoms will get better, but your lower back pain may worsen. This is typically an indication of progress in your rehabilitation. Be very alert to any changes in your symptoms and the activities in which you participated in the 24 hours prior to the change. Sharing this information with your caregiver will allow him/her to most efficiently treat your condition.  These exercises may help you when beginning to rehabilitate your injury. Your symptoms may resolve with or without further involvement from your physician, physical therapist or athletic trainer. While completing these exercises, remember:  Restoring tissue flexibility helps normal motion to return to the joints. This allows healthier, less painful movement and activity.  An effective stretch should be held for at least 30 seconds.  A stretch should never be painful. You should only feel a gentle lengthening or release in the stretched tissue. FLEXION RANGE OF MOTION AND STRETCHING EXERCISES: STRETCH - Flexion, Single Knee to Chest   Lie on a firm bed or floor with both legs extended in front of you.  Keeping one leg in contact with the floor, bring your opposite knee to your chest. Hold your leg in place by either grabbing behind your thigh or at your knee.  Pull until you feel a gentle stretch in your lower back. Hold __________ seconds.  Slowly release your grasp and repeat the exercise with the opposite side. Repeat __________ times. Complete this exercise __________ times per day.  STRETCH - Flexion, Double Knee to Chest   Lie on a firm bed or floor with both legs extended in front of you.  Keeping one leg in contact with the floor, bring your opposite knee to your chest.  Tense your stomach muscles to support your back and then lift your other knee to your chest. Hold your legs in place by either grabbing behind your thighs or at your knees.  Pull both knees toward  your chest until you feel a gentle stretch in your lower back. Hold __________ seconds.  Tense your stomach muscles and slowly return one leg at a time to the floor. Repeat __________ times. Complete this exercise __________ times per day.  STRETCH -  Low Trunk Rotation  Lie on a firm bed or floor. Keeping your legs in front of you, bend your knees so they are both pointed toward the ceiling and your feet are flat on the floor.  Extend your arms out to the side. This will stabilize your upper body by keeping your shoulders in contact with the floor.  Gently and slowly drop both knees together to one side until you feel a gentle stretch in your lower back. Hold for __________ seconds.  Tense your stomach muscles to support your lower back as you bring your knees back to the starting position. Repeat the exercise to the other side. Repeat __________ times. Complete this exercise __________ times per day  EXTENSION RANGE OF MOTION AND FLEXIBILITY EXERCISES: STRETCH - Extension, Prone on Elbows   Lie on your stomach on the floor, a bed will be too soft. Place your palms about shoulder width apart and at the height of your head.  Place your elbows under your shoulders. If this is too painful, stack pillows under your chest.  Allow your body to relax so that your hips drop lower and make contact more completely with the floor.  Hold this position for __________ seconds.  Slowly return to lying flat on the floor. Repeat __________ times. Complete this exercise __________ times per day.  RANGE OF MOTION - Extension, Prone Press Ups  Lie on your stomach on the floor, a bed will be too soft. Place your palms about shoulder width apart and at the height of your head.  Keeping your back as relaxed as possible, slowly straighten your elbows while keeping your hips on the floor. You may adjust the placement of your hands to maximize your comfort. As you gain motion, your hands will come more  underneath your shoulders.  Hold this position __________ seconds.  Slowly return to lying flat on the floor. Repeat __________ times. Complete this exercise __________ times per day.  RANGE OF MOTION- Quadruped, Neutral Spine   Assume a hands and knees position on a firm surface. Keep your hands under your shoulders and your knees under your hips. You may place padding under your knees for comfort.  Drop your head and point your tail bone toward the ground below you. This will round out your lower back like an angry cat. Hold this position for __________ seconds.  Slowly lift your head and release your tail bone so that your back sags into a large arch, like an old horse.  Hold this position for __________ seconds.  Repeat this until you feel limber in your lower back.  Now, find your "sweet spot." This will be the most comfortable position somewhere between the two previous positions. This is your neutral spine. Once you have found this position, tense your stomach muscles to support your lower back.  Hold this position for __________ seconds. Repeat __________ times. Complete this exercise __________ times per day.  STRENGTHENING EXERCISES - Low Back Strain These exercises may help you when beginning to rehabilitate your injury. These exercises should be done near your "sweet spot." This is the neutral, low-back arch, somewhere between fully rounded and fully arched, that is your least painful position. When performed in this safe range of motion, these exercises can be used for people who have either a flexion or extension based injury. These exercises may resolve your symptoms with or without further involvement from your physician, physical therapist or athletic trainer. While completing these exercises, remember:   Muscles can  gain both the endurance and the strength needed for everyday activities through controlled exercises.  Complete these exercises as instructed by your  physician, physical therapist or athletic trainer. Increase the resistance and repetitions only as guided.  You may experience muscle soreness or fatigue, but the pain or discomfort you are trying to eliminate should never worsen during these exercises. If this pain does worsen, stop and make certain you are following the directions exactly. If the pain is still present after adjustments, discontinue the exercise until you can discuss the trouble with your caregiver. STRENGTHENING - Deep Abdominals, Pelvic Tilt  Lie on a firm bed or floor. Keeping your legs in front of you, bend your knees so they are both pointed toward the ceiling and your feet are flat on the floor.  Tense your lower abdominal muscles to press your lower back into the floor. This motion will rotate your pelvis so that your tail bone is scooping upwards rather than pointing at your feet or into the floor.  With a gentle tension and even breathing, hold this position for __________ seconds. Repeat __________ times. Complete this exercise __________ times per day.  STRENGTHENING - Abdominals, Crunches   Lie on a firm bed or floor. Keeping your legs in front of you, bend your knees so they are both pointed toward the ceiling and your feet are flat on the floor. Cross your arms over your chest.  Slightly tip your chin down without bending your neck.  Tense your abdominals and slowly lift your trunk high enough to just clear your shoulder blades. Lifting higher can put excessive stress on the lower back and does not further strengthen your abdominal muscles.  Control your return to the starting position. Repeat __________ times. Complete this exercise __________ times per day.  STRENGTHENING - Quadruped, Opposite UE/LE Lift   Assume a hands and knees position on a firm surface. Keep your hands under your shoulders and your knees under your hips. You may place padding under your knees for comfort.  Find your neutral spine and  gently tense your abdominal muscles so that you can maintain this position. Your shoulders and hips should form a rectangle that is parallel with the floor and is not twisted.  Keeping your trunk steady, lift your right hand no higher than your shoulder and then your left leg no higher than your hip. Make sure you are not holding your breath. Hold this position __________ seconds.  Continuing to keep your abdominal muscles tense and your back steady, slowly return to your starting position. Repeat with the opposite arm and leg. Repeat __________ times. Complete this exercise __________ times per day.  STRENGTHENING - Lower Abdominals, Double Knee Lift  Lie on a firm bed or floor. Keeping your legs in front of you, bend your knees so they are both pointed toward the ceiling and your feet are flat on the floor.  Tense your abdominal muscles to brace your lower back and slowly lift both of your knees until they come over your hips. Be certain not to hold your breath.  Hold __________ seconds. Using your abdominal muscles, return to the starting position in a slow and controlled manner. Repeat __________ times. Complete this exercise __________ times per day.  POSTURE AND BODY MECHANICS CONSIDERATIONS - Low Back Strain Keeping correct posture when sitting, standing or completing your activities will reduce the stress put on different body tissues, allowing injured tissues a chance to heal and limiting painful experiences. The following are  general guidelines for improved posture. Your physician or physical therapist will provide you with any instructions specific to your needs. While reading these guidelines, remember:  The exercises prescribed by your provider will help you have the flexibility and strength to maintain correct postures.  The correct posture provides the best environment for your joints to work. All of your joints have less wear and tear when properly supported by a spine with good  posture. This means you will experience a healthier, less painful body.  Correct posture must be practiced with all of your activities, especially prolonged sitting and standing. Correct posture is as important when doing repetitive low-stress activities (typing) as it is when doing a single heavy-load activity (lifting). RESTING POSITIONS Consider which positions are most painful for you when choosing a resting position. If you have pain with flexion-based activities (sitting, bending, stooping, squatting), choose a position that allows you to rest in a less flexed posture. You would want to avoid curling into a fetal position on your side. If your pain worsens with extension-based activities (prolonged standing, working overhead), avoid resting in an extended position such as sleeping on your stomach. Most people will find more comfort when they rest with their spine in a more neutral position, neither too rounded nor too arched. Lying on a non-sagging bed on your side with a pillow between your knees, or on your back with a pillow under your knees will often provide some relief. Keep in mind, being in any one position for a prolonged period of time, no matter how correct your posture, can still lead to stiffness. PROPER SITTING POSTURE In order to minimize stress and discomfort on your spine, you must sit with correct posture. Sitting with good posture should be effortless for a healthy body. Returning to good posture is a gradual process. Many people can work toward this most comfortably by using various supports until they have the flexibility and strength to maintain this posture on their own. When sitting with proper posture, your ears will fall over your shoulders and your shoulders will fall over your hips. You should use the back of the chair to support your upper back. Your lower back will be in a neutral position, just slightly arched. You may place a small pillow or folded towel at the base of  your lower back for support.  When working at a desk, create an environment that supports good, upright posture. Without extra support, muscles tire, which leads to excessive strain on joints and other tissues. Keep these recommendations in mind: CHAIR:  A chair should be able to slide under your desk when your back makes contact with the back of the chair. This allows you to work closely.  The chair's height should allow your eyes to be level with the upper part of your monitor and your hands to be slightly lower than your elbows. BODY POSITION  Your feet should make contact with the floor. If this is not possible, use a foot rest.  Keep your ears over your shoulders. This will reduce stress on your neck and lower back. INCORRECT SITTING POSTURES  If you are feeling tired and unable to assume a healthy sitting posture, do not slouch or slump. This puts excessive strain on your back tissues, causing more damage and pain. Healthier options include:  Using more support, like a lumbar pillow.  Switching tasks to something that requires you to be upright or walking.  Talking a brief walk.  Lying down to rest  in a neutral-spine position. PROLONGED STANDING WHILE SLIGHTLY LEANING FORWARD  When completing a task that requires you to lean forward while standing in one place for a long time, place either foot up on a stationary 2-4 inch high object to help maintain the best posture. When both feet are on the ground, the lower back tends to lose its slight inward curve. If this curve flattens (or becomes too large), then the back and your other joints will experience too much stress, tire more quickly, and can cause pain. CORRECT STANDING POSTURES Proper standing posture should be assumed with all daily activities, even if they only take a few moments, like when brushing your teeth. As in sitting, your ears should fall over your shoulders and your shoulders should fall over your hips. You should keep  a slight tension in your abdominal muscles to brace your spine. Your tailbone should point down to the ground, not behind your body, resulting in an over-extended swayback posture.  INCORRECT STANDING POSTURES  Common incorrect standing postures include a forward head, locked knees and/or an excessive swayback. WALKING Walk with an upright posture. Your ears, shoulders and hips should all line-up. PROLONGED ACTIVITY IN A FLEXED POSITION When completing a task that requires you to bend forward at your waist or lean over a low surface, try to find a way to stabilize 3 out of 4 of your limbs. You can place a hand or elbow on your thigh or rest a knee on the surface you are reaching across. This will provide you more stability so that your muscles do not fatigue as quickly. By keeping your knees relaxed, or slightly bent, you will also reduce stress across your lower back. CORRECT LIFTING TECHNIQUES DO :   Assume a wide stance. This will provide you more stability and the opportunity to get as close as possible to the object which you are lifting.  Tense your abdominals to brace your spine. Bend at the knees and hips. Keeping your back locked in a neutral-spine position, lift using your leg muscles. Lift with your legs, keeping your back straight.  Test the weight of unknown objects before attempting to lift them.  Try to keep your elbows locked down at your sides in order get the best strength from your shoulders when carrying an object.  Always ask for help when lifting heavy or awkward objects. INCORRECT LIFTING TECHNIQUES DO NOT:   Lock your knees when lifting, even if it is a small object.  Bend and twist. Pivot at your feet or move your feet when needing to change directions.  Assume that you can safely pick up even a paper clip without proper posture. Document Released: 06/04/2005 Document Revised: 08/27/2011 Document Reviewed: 09/16/2008 [facility] Patient Information 2015 [place],  [place]. This information is not intended to replace advice given to you by your health care provider. Make sure you discuss any questions you have with your health care provider.      I personally performed the services described in this documentation, which was scribed in my presence. The recorded information has been reviewed and considered, and addended by me as needed.

## 2015-01-11 ENCOUNTER — Ambulatory Visit (INDEPENDENT_AMBULATORY_CARE_PROVIDER_SITE_OTHER): Payer: Worker's Compensation | Admitting: Emergency Medicine

## 2015-01-11 DIAGNOSIS — M5441 Lumbago with sciatica, right side: Secondary | ICD-10-CM

## 2015-01-11 MED ORDER — NAPROXEN SODIUM 550 MG PO TABS: 550.0000 mg | ORAL_TABLET | Freq: Two times a day (BID) | ORAL | Status: AC

## 2015-01-11 MED ORDER — OXYCODONE HCL 5 MG PO TABS: 5.0000 mg | ORAL_TABLET | ORAL | Status: DC | PRN

## 2015-01-11 NOTE — Progress Notes (Signed)
Subjective:  Patient ID: Casey Mercer, male    DOB: Dec 09, 1980  Age: 34 y.o. MRN: [id number]  CC: Follow-up   HPI JOY HAEGELE presents  with back pain. He was seen last week with low back pain after lifting a gait. He was put on light duty and given a prescription for Flexeril and Modic. He's been unable to do his regular job and was improving his minimally over the weekend but not sleeping at night due to pain. Then on Saturday or Sunday he bent over and try to pick up some money that a fall in the flooring and reinjured his back. He has some radicular pain down the right side. Involving his knee. He's had limited improvement with over-the-counter prescribed medication  History Harim has a past medical history of Back pain; Gastric ulcer, chronic; Depression; Anxiety; Chronic pain; Arthritis; Bipolar 1 disorder; Panic attacks; and Substance induced mood disorder (07/19/2012).   He has past surgical history that includes none.   His  family history is not on file.  He   reports that he has been smoking.  He has never used smokeless tobacco. He reports that he drinks about 0.6 oz of alcohol per week. He reports that he does not use illicit drugs.  Outpatient Prescriptions Prior to Visit  Medication Sig Dispense Refill  . cyclobenzaprine (FLEXERIL) 5 MG tablet 1/2 to 1 tablet by mouth up to every 8 hours as needed. Start at bedtime as needed due to sedation 15 tablet 0  . ibuprofen (ADVIL,MOTRIN) 800 MG tablet Take 1 tablet (800 mg total) by mouth every 8 (eight) hours as needed for moderate pain. 21 tablet 0  . meloxicam (MOBIC) 7.5 MG tablet Take 1-2 tablets (7.5-15 mg total) by mouth daily. 30 tablet 0   No facility-administered medications prior to visit.    History   Social History  . Marital Status: Divorced    Spouse Name: N/A  . Number of Children: N/A  . Years of Education: N/A   Social History Main Topics  . Smoking status: Current Every Day Smoker -- 0.00 packs/day  for 0 years  . Smokeless tobacco: Never Used  . Alcohol Use: 0.6 oz/week    0 Standard drinks or equivalent, 1 Cans of beer per week  . Drug Use: No  . Sexual Activity: Not on file   Other Topics Concern  . None   Social History Narrative     Review of Systems  Constitutional: Negative for fever, chills and appetite change.  HENT: Negative for congestion, ear pain, postnasal drip, sinus pressure and sore throat.   Eyes: Negative for pain and redness.  Respiratory: Negative for cough, shortness of breath and wheezing.   Cardiovascular: Negative for leg swelling.  Gastrointestinal: Negative for nausea, vomiting, abdominal pain, diarrhea, constipation and blood in stool.  Endocrine: Negative for polyuria.  Genitourinary: Negative for dysuria, urgency, frequency and flank pain.  Musculoskeletal: Negative for gait problem.  Skin: Negative for rash.  Neurological: Negative for weakness and headaches.  Psychiatric/Behavioral: Negative for confusion and decreased concentration. The patient is not nervous/anxious.     Objective:  BP 112/70 mmHg  Pulse 83  Temp(Src) 98.1 F (36.7 C) (Oral)  Resp 16  Ht 6' (1.829 m)  Wt 173 lb (78.472 kg)  BMI 23.46 kg/m2  SpO2 99%  Physical Exam  Constitutional: He is oriented to person, place, and time. He appears well-developed and well-nourished.  HENT:  Head: Normocephalic and atraumatic.  Eyes: Conjunctivae  are normal. Pupils are equal, round, and reactive to light.  Pulmonary/Chest: Effort normal.  Musculoskeletal: He exhibits no edema.       Lumbar back: He exhibits tenderness.  Neurological: He is alert and oriented to person, place, and time. Gait abnormal.  Skin: Skin is dry.  Psychiatric: He has a normal mood and affect. His behavior is normal. Thought content normal.      Assessment & Plan:   Sujay was seen today for follow-up.  Diagnoses and all orders for this visit:  Right-sided low back pain with right-sided  sciatica  Other orders -     oxyCODONE (OXY IR/ROXICODONE) 5 MG immediate release tablet; Take 1 tablet (5 mg total) by mouth every 4 (four) hours as needed for severe pain. -     naproxen sodium (ANAPROX DS) 550 MG tablet; Take 1 tablet (550 mg total) by mouth 2 (two) times daily with a meal.   I am having Mr. Azimi start on oxyCODONE and naproxen sodium. I am also having him maintain his ibuprofen, cyclobenzaprine, and meloxicam.  Meds ordered this encounter  Medications  . oxyCODONE (OXY IR/ROXICODONE) 5 MG immediate release tablet    Sig: Take 1 tablet (5 mg total) by mouth every 4 (four) hours as needed for severe pain.    Dispense:  30 tablet    Refill:  0  . naproxen sodium (ANAPROX DS) 550 MG tablet    Sig: Take 1 tablet (550 mg total) by mouth 2 (two) times daily with a meal.    Dispense:  40 tablet    Refill:  0    Appropriate red flag conditions were discussed with the patient as well as actions that should be taken.  Patient expressed his understanding.  Follow-up: Return in about 1 week (around 01/18/2015).  Carmelina Dane, MD

## 2015-01-11 NOTE — Patient Instructions (Signed)
Sciatica Sciatica is pain, weakness, numbness, or tingling along the path of the sciatic nerve. The nerve starts in the lower back and runs down the back of each leg. The nerve controls the muscles in the lower leg and in the back of the knee, while also providing sensation to the back of the thigh, lower leg, and the sole of your foot. Sciatica is a symptom of another medical condition. For instance, nerve damage or certain conditions, such as a herniated disk or bone spur on the spine, pinch or put pressure on the sciatic nerve. This causes the pain, weakness, or other sensations normally associated with sciatica. Generally, sciatica only affects one side of the body. CAUSES   Herniated or slipped disc.  Degenerative disk disease.  A pain disorder involving the narrow muscle in the buttocks (piriformis syndrome).  Pelvic injury or fracture.  Pregnancy.  Tumor (rare). SYMPTOMS  Symptoms can vary from mild to very severe. The symptoms usually travel from the low back to the buttocks and down the back of the leg. Symptoms can include:  Mild tingling or dull aches in the lower back, leg, or hip.  Numbness in the back of the calf or sole of the foot.  Burning sensations in the lower back, leg, or hip.  Sharp pains in the lower back, leg, or hip.  Leg weakness.  Severe back pain inhibiting movement. These symptoms may get worse with coughing, sneezing, laughing, or prolonged sitting or standing. Also, being overweight may worsen symptoms. DIAGNOSIS  Your caregiver will perform a physical exam to look for common symptoms of sciatica. He or she may ask you to do certain movements or activities that would trigger sciatic nerve pain. Other tests may be performed to find the cause of the sciatica. These may include:  Blood tests.  X-rays.  Imaging tests, such as an MRI or CT scan. TREATMENT  Treatment is directed at the cause of the sciatic pain. Sometimes, treatment is not necessary  and the pain and discomfort goes away on its own. If treatment is needed, your caregiver may suggest:  Over-the-counter medicines to relieve pain.  Prescription medicines, such as anti-inflammatory medicine, muscle relaxants, or narcotics.  Applying heat or ice to the painful area.  Steroid injections to lessen pain, irritation, and inflammation around the nerve.  Reducing activity during periods of pain.  Exercising and stretching to strengthen your abdomen and improve flexibility of your spine. Your caregiver may suggest losing weight if the extra weight makes the back pain worse.  Physical therapy.  Surgery to eliminate what is pressing or pinching the nerve, such as a bone spur or part of a herniated disk. HOME CARE INSTRUCTIONS   Only take over-the-counter or prescription medicines for pain or discomfort as directed by your caregiver.  Apply ice to the affected area for 20 minutes, 3-4 times a day for the first 48-72 hours. Then try heat in the same way.  Exercise, stretch, or perform your usual activities if these do not aggravate your pain.  Attend physical therapy sessions as directed by your caregiver.  Keep all follow-up appointments as directed by your caregiver.  Do not wear high heels or shoes that do not provide proper support.  Check your mattress to see if it is too soft. A firm mattress may lessen your pain and discomfort. SEEK IMMEDIATE MEDICAL CARE IF:   You lose control of your bowel or bladder (incontinence).  You have increasing weakness in the lower back, pelvis, buttocks,   or legs.  You have redness or swelling of your back.  You have a burning sensation when you urinate.  You have pain that gets worse when you lie down or awakens you at night.  Your pain is worse than you have experienced in the past.  Your pain is lasting longer than 4 weeks.  You are suddenly losing weight without reason. MAKE SURE YOU:  Understand these  instructions.  Will watch your condition.  Will get help right away if you are not doing well or get worse. Document Released: 05/29/2001 Document Revised: 12/04/2011 Document Reviewed: 10/14/2011 [employer] Patient Information 2015 [employer]. This information is not intended to replace advice given to you by your health care provider. Make sure you discuss any questions you have with your health care provider.  

## 2015-01-17 ENCOUNTER — Ambulatory Visit (INDEPENDENT_AMBULATORY_CARE_PROVIDER_SITE_OTHER): Payer: Worker's Compensation | Admitting: Emergency Medicine

## 2015-01-17 VITALS — BP 122/72 | HR 85 | Temp 98.2°F | Resp 17 | Ht 72.0 in | Wt 172.0 lb

## 2015-01-17 DIAGNOSIS — M545 Low back pain: Secondary | ICD-10-CM

## 2015-01-17 MED ORDER — PREDNISONE 10 MG (48) PO TBPK: ORAL_TABLET | ORAL | Status: AC

## 2015-01-17 NOTE — Progress Notes (Signed)
   Subjective:  Patient ID: Casey Mercer, male    DOB: 24-Jun-1980  Age: 34 y.o. MRN: [id number]  CC: Follow-up   HPI Casey Mercer presents  with pain in low back radiating into his right knee posteriorly down the back of his thigh. Direct injury. He was seen he was seen initially on 01/07/2015 and again last week on the 26th he had improvement in his back pain and then bent over to pick something up and again reinjured his back. He's been off work due to his inability to be accommodated with light duty.  History  Past medical social and family history been reviewed and are noncontributory  Review of Systems  Constitutional: Negative for fever, chills and appetite change.  HENT: Negative for congestion, ear pain, postnasal drip, sinus pressure and sore throat.   Eyes: Negative for pain and redness.  Respiratory: Negative for cough, shortness of breath and wheezing.   Cardiovascular: Negative for leg swelling.  Gastrointestinal: Negative for nausea, vomiting, abdominal pain, diarrhea, constipation and blood in stool.  Endocrine: Negative for polyuria.  Genitourinary: Negative for dysuria, urgency, frequency and flank pain.  Musculoskeletal: Positive for back pain. Negative for gait problem.  Skin: Negative for rash.  Neurological: Negative for weakness and headaches.  Psychiatric/Behavioral: Negative for confusion and decreased concentration. The patient is not nervous/anxious.     Objective:  BP 122/72 mmHg  Pulse 85  Temp(Src) 98.2 F (36.8 C) (Oral)  Resp 17  Ht 6' (1.829 m)  Wt 172 lb (78.019 kg)  BMI 23.32 kg/m2  SpO2 98%  Physical Exam  Constitutional: He is oriented to person, place, and time. He appears well-developed and well-nourished.  HENT:  Head: Normocephalic and atraumatic.  Eyes: Conjunctivae are normal. Pupils are equal, round, and reactive to light.  Pulmonary/Chest: Effort normal.  Musculoskeletal: He exhibits no edema.       Lumbar back: He exhibits  tenderness.  Neurological: He is alert and oriented to person, place, and time.  Skin: Skin is dry.  Psychiatric: He has a normal mood and affect. His behavior is normal. Thought content normal.      Assessment & Plan:   [name] was seen today for follow-up.  Diagnoses and all orders for this visit:  Low back pain, unspecified back pain laterality, with sciatica presence unspecified  Other orders -     predniSONE (STERAPRED UNI-PAK 48 TAB) 10 MG (48) TBPK tablet; Take as directed on package  I am having [name] start on predniSONE. I am also having him maintain his ibuprofen, cyclobenzaprine, meloxicam, oxyCODONE, and naproxen sodium.  Meds ordered this encounter  Medications  . predniSONE (STERAPRED UNI-PAK 48 TAB) 10 MG (48) TBPK tablet    Sig: Take as directed on package    Dispense:  48 tablet    Refill:  0    Appropriate red flag conditions were discussed with the patient as well as actions that should be taken.  Patient expressed his understanding.  Follow-up: Return in about 1 week (around 01/24/2015), or if symptoms worsen or fail to improve.  Carmelina Dane, MD

## 2015-01-17 NOTE — Patient Instructions (Signed)
Sciatica Sciatica is pain, weakness, numbness, or tingling along the path of the sciatic nerve. The nerve starts in the lower back and runs down the back of each leg. The nerve controls the muscles in the lower leg and in the back of the knee, while also providing sensation to the back of the thigh, lower leg, and the sole of your foot. Sciatica is a symptom of another medical condition. For instance, nerve damage or certain conditions, such as a herniated disk or bone spur on the spine, pinch or put pressure on the sciatic nerve. This causes the pain, weakness, or other sensations normally associated with sciatica. Generally, sciatica only affects one side of the body. CAUSES   Herniated or slipped disc.  Degenerative disk disease.  A pain disorder involving the narrow muscle in the buttocks (piriformis syndrome).  Pelvic injury or fracture.  Pregnancy.  Tumor (rare). SYMPTOMS  Symptoms can vary from mild to very severe. The symptoms usually travel from the low back to the buttocks and down the back of the leg. Symptoms can include:  Mild tingling or dull aches in the lower back, leg, or hip.  Numbness in the back of the calf or sole of the foot.  Burning sensations in the lower back, leg, or hip.  Sharp pains in the lower back, leg, or hip.  Leg weakness.  Severe back pain inhibiting movement. These symptoms may get worse with coughing, sneezing, laughing, or prolonged sitting or standing. Also, being overweight may worsen symptoms. DIAGNOSIS  Your caregiver will perform a physical exam to look for common symptoms of sciatica. He or she may ask you to do certain movements or activities that would trigger sciatic nerve pain. Other tests may be performed to find the cause of the sciatica. These may include:  Blood tests.  X-rays.  Imaging tests, such as an MRI or CT scan. TREATMENT  Treatment is directed at the cause of the sciatic pain. Sometimes, treatment is not necessary  and the pain and discomfort goes away on its own. If treatment is needed, your caregiver may suggest:  Over-the-counter medicines to relieve pain.  Prescription medicines, such as anti-inflammatory medicine, muscle relaxants, or narcotics.  Applying heat or ice to the painful area.  Steroid injections to lessen pain, irritation, and inflammation around the nerve.  Reducing activity during periods of pain.  Exercising and stretching to strengthen your abdomen and improve flexibility of your spine. Your caregiver may suggest losing weight if the extra weight makes the back pain worse.  Physical therapy.  Surgery to eliminate what is pressing or pinching the nerve, such as a bone spur or part of a herniated disk. HOME CARE INSTRUCTIONS   Only take over-the-counter or prescription medicines for pain or discomfort as directed by your caregiver.  Apply ice to the affected area for 20 minutes, 3-4 times a day for the first 48-72 hours. Then try heat in the same way.  Exercise, stretch, or perform your usual activities if these do not aggravate your pain.  Attend physical therapy sessions as directed by your caregiver.  Keep all follow-up appointments as directed by your caregiver.  Do not wear high heels or shoes that do not provide proper support.  Check your mattress to see if it is too soft. A firm mattress may lessen your pain and discomfort. SEEK IMMEDIATE MEDICAL CARE IF:   You lose control of your bowel or bladder (incontinence).  You have increasing weakness in the lower back, pelvis, buttocks,   or legs.  You have redness or swelling of your back.  You have a burning sensation when you urinate.  You have pain that gets worse when you lie down or awakens you at night.  Your pain is worse than you have experienced in the past.  Your pain is lasting longer than 4 weeks.  You are suddenly losing weight without reason. MAKE SURE YOU:  Understand these  instructions.  Will watch your condition.  Will get help right away if you are not doing well or get worse. Document Released: 05/29/2001 Document Revised: 12/04/2011 Document Reviewed: 10/14/2011 [employer] Patient Information 2015 [employer]. This information is not intended to replace advice given to you by your health care provider. Make sure you discuss any questions you have with your health care provider.  

## 2015-01-19 ENCOUNTER — Telehealth: Payer: Self-pay

## 2015-01-19 ENCOUNTER — Ambulatory Visit (INDEPENDENT_AMBULATORY_CARE_PROVIDER_SITE_OTHER): Payer: Worker's Compensation | Admitting: Emergency Medicine

## 2015-01-19 VITALS — BP 132/74 | HR 59 | Temp 97.8°F | Resp 16 | Ht 72.0 in | Wt 171.6 lb

## 2015-01-19 DIAGNOSIS — M5441 Lumbago with sciatica, right side: Secondary | ICD-10-CM | POA: Diagnosis not present

## 2015-01-19 MED ORDER — CYCLOBENZAPRINE HCL 10 MG PO TABS: 10.0000 mg | ORAL_TABLET | Freq: Three times a day (TID) | ORAL | Status: AC | PRN

## 2015-01-19 MED ORDER — OXYCODONE HCL 5 MG PO TABS: 5.0000 mg | ORAL_TABLET | ORAL | Status: AC | PRN

## 2015-01-19 NOTE — Progress Notes (Signed)
   Subjective:  Patient ID: Casey Mercer, male    DOB: Sep 17, 1980  Age: 34 y.o. MRN: [id number]  CC: Follow-up and Medication Refill   HPI SHERWIN HOLLINGSHED presents  for follow-up of low back injury. He was initially injured on July 22 and is been seen several times with treated with pain medication radiograph and cortisone. He's had transient improvement is waxed and waned. He has some radicular pain down into his right leg to his knee. He has marked difficulty getting up and getting around. He essentially had very little improvement. He is exhausted his supply of medication both narcotic and muscle relaxant  History His family social and past medical history were reviewed and are noncontributory  Review of Systems   His review of systems is unremarkable   Objective:  BP 132/74 mmHg  Pulse 59  Temp(Src) 97.8 F (36.6 C) (Oral)  Resp 16  Ht 6' (1.829 m)  Wt 171 lb 9.6 oz (77.837 kg)  BMI 23.27 kg/m2  SpO2 99%  Physical Exam  Constitutional: He is oriented to person, place, and time. He appears well-developed and well-nourished. No distress.  HENT:  Head: Normocephalic and atraumatic.  Right Ear: External ear normal.  Left Ear: External ear normal.  Nose: Nose normal.  Eyes: Conjunctivae and EOM are normal. Pupils are equal, round, and reactive to light. No scleral icterus.  Neck: Normal range of motion. Neck supple. No tracheal deviation present.  Cardiovascular: Normal rate, regular rhythm and normal heart sounds.   Pulmonary/Chest: Effort normal. No respiratory distress. He has no wheezes. He has no rales.  Abdominal: He exhibits no mass. There is no tenderness. There is no rebound and no guarding.  Musculoskeletal: He exhibits no edema.       Lumbar back: He exhibits decreased range of motion and tenderness.  Lymphadenopathy:    He has no cervical adenopathy.  Neurological: He is alert and oriented to person, place, and time. Coordination normal.  Skin: Skin is warm and  dry. No rash noted.  Psychiatric: He has a normal mood and affect. His behavior is normal.      Assessment & Plan:   There are no diagnoses linked to this encounter. I am having Mr. Trueheart maintain his ibuprofen, cyclobenzaprine, meloxicam, oxyCODONE, naproxen sodium, and predniSONE.  No orders of the defined types were placed in this encounter.    he was continued out of work and will undergo an MRI as soon as it can be accomplished he'll follow-up in week   Appropriate red flag conditions were discussed with the patient as well as actions that should be taken.  Patient expressed his understanding.  Follow-up: No Follow-up on file.  Carmelina Dane, MD

## 2015-01-19 NOTE — Patient Instructions (Signed)
Sciatica Sciatica is pain, weakness, numbness, or tingling along the path of the sciatic nerve. The nerve starts in the lower back and runs down the back of each leg. The nerve controls the muscles in the lower leg and in the back of the knee, while also providing sensation to the back of the thigh, lower leg, and the sole of your foot. Sciatica is a symptom of another medical condition. For instance, nerve damage or certain conditions, such as a herniated disk or bone spur on the spine, pinch or put pressure on the sciatic nerve. This causes the pain, weakness, or other sensations normally associated with sciatica. Generally, sciatica only affects one side of the body. CAUSES   Herniated or slipped disc.  Degenerative disk disease.  A pain disorder involving the narrow muscle in the buttocks (piriformis syndrome).  Pelvic injury or fracture.  Pregnancy.  Tumor (rare). SYMPTOMS  Symptoms can vary from mild to very severe. The symptoms usually travel from the low back to the buttocks and down the back of the leg. Symptoms can include:  Mild tingling or dull aches in the lower back, leg, or hip.  Numbness in the back of the calf or sole of the foot.  Burning sensations in the lower back, leg, or hip.  Sharp pains in the lower back, leg, or hip.  Leg weakness.  Severe back pain inhibiting movement. These symptoms may get worse with coughing, sneezing, laughing, or prolonged sitting or standing. Also, being overweight may worsen symptoms. DIAGNOSIS  Your caregiver will perform a physical exam to look for common symptoms of sciatica. He or she may ask you to do certain movements or activities that would trigger sciatic nerve pain. Other tests may be performed to find the cause of the sciatica. These may include:  Blood tests.  X-rays.  Imaging tests, such as an MRI or CT scan. TREATMENT  Treatment is directed at the cause of the sciatic pain. Sometimes, treatment is not necessary  and the pain and discomfort goes away on its own. If treatment is needed, your caregiver may suggest:  Over-the-counter medicines to relieve pain.  Prescription medicines, such as anti-inflammatory medicine, muscle relaxants, or narcotics.  Applying heat or ice to the painful area.  Steroid injections to lessen pain, irritation, and inflammation around the nerve.  Reducing activity during periods of pain.  Exercising and stretching to strengthen your abdomen and improve flexibility of your spine. Your caregiver may suggest losing weight if the extra weight makes the back pain worse.  Physical therapy.  Surgery to eliminate what is pressing or pinching the nerve, such as a bone spur or part of a herniated disk. HOME CARE INSTRUCTIONS   Only take over-the-counter or prescription medicines for pain or discomfort as directed by your caregiver.  Apply ice to the affected area for 20 minutes, 3-4 times a day for the first 48-72 hours. Then try heat in the same way.  Exercise, stretch, or perform your usual activities if these do not aggravate your pain.  Attend physical therapy sessions as directed by your caregiver.  Keep all follow-up appointments as directed by your caregiver.  Do not wear high heels or shoes that do not provide proper support.  Check your mattress to see if it is too soft. A firm mattress may lessen your pain and discomfort. SEEK IMMEDIATE MEDICAL CARE IF:   You lose control of your bowel or bladder (incontinence).  You have increasing weakness in the lower back, pelvis, buttocks,   or legs.  You have redness or swelling of your back.  You have a burning sensation when you urinate.  You have pain that gets worse when you lie down or awakens you at night.  Your pain is worse than you have experienced in the past.  Your pain is lasting longer than 4 weeks.  You are suddenly losing weight without reason. MAKE SURE YOU:  Understand these  instructions.  Will watch your condition.  Will get help right away if you are not doing well or get worse. Document Released: 05/29/2001 Document Revised: 12/04/2011 Document Reviewed: 10/14/2011 [employer] Patient Information 2015 [employer]. This information is not intended to replace advice given to you by your health care provider. Make sure you discuss any questions you have with your health care provider.  

## 2015-01-19 NOTE — Telephone Encounter (Signed)
Patient called in because he is still in a lot of pain I advised him to come into be seen. He needs medication refills on the Oxycodone, Cyclobenzaprine as well.

## 2015-01-19 NOTE — Telephone Encounter (Signed)
Agree Dr. Dareen Piano put in his note to RTC on 01/18/2015.

## 2015-01-19 NOTE — Telephone Encounter (Signed)
Pt.notified

## 2015-11-21 ENCOUNTER — Emergency Department (HOSPITAL_COMMUNITY)
Admission: EM | Admit: 2015-11-21 | Discharge: 2015-11-22 | Disposition: A | Discharge status: Home / Self Care | Payer: Self-pay | Attending: Emergency Medicine | Admitting: Emergency Medicine

## 2015-11-21 ENCOUNTER — Encounter (HOSPITAL_COMMUNITY): Payer: Self-pay | Admitting: *Deleted

## 2015-11-21 DIAGNOSIS — Z8659 Personal history of other mental and behavioral disorders: Secondary | ICD-10-CM | POA: Insufficient documentation

## 2015-11-21 DIAGNOSIS — Y9389 Activity, other specified: Secondary | ICD-10-CM | POA: Insufficient documentation

## 2015-11-21 DIAGNOSIS — M1990 Unspecified osteoarthritis, unspecified site: Secondary | ICD-10-CM | POA: Insufficient documentation

## 2015-11-21 DIAGNOSIS — Z8719 Personal history of other diseases of the digestive system: Secondary | ICD-10-CM | POA: Insufficient documentation

## 2015-11-21 DIAGNOSIS — G8929 Other chronic pain: Secondary | ICD-10-CM | POA: Insufficient documentation

## 2015-11-21 DIAGNOSIS — Y9289 Other specified places as the place of occurrence of the external cause: Secondary | ICD-10-CM | POA: Insufficient documentation

## 2015-11-21 DIAGNOSIS — F1310 Sedative, hypnotic or anxiolytic abuse, uncomplicated: Secondary | ICD-10-CM | POA: Insufficient documentation

## 2015-11-21 DIAGNOSIS — T401X1A Poisoning by heroin, accidental (unintentional), initial encounter: Secondary | ICD-10-CM | POA: Insufficient documentation

## 2015-11-21 DIAGNOSIS — Y998 Other external cause status: Secondary | ICD-10-CM | POA: Insufficient documentation

## 2015-11-21 DIAGNOSIS — F1410 Cocaine abuse, uncomplicated: Secondary | ICD-10-CM | POA: Insufficient documentation

## 2015-11-21 DIAGNOSIS — F17210 Nicotine dependence, cigarettes, uncomplicated: Secondary | ICD-10-CM | POA: Insufficient documentation

## 2015-11-21 LAB — BASIC METABOLIC PANEL
ANION GAP: 9 (ref 5–15)
BUN: 21 mg/dL — ABNORMAL HIGH (ref 6–20)
CHLORIDE: 102 mmol/L (ref 101–111)
CO2: 24 mmol/L (ref 22–32)
Calcium: 9.3 mg/dL (ref 8.9–10.3)
Creatinine, Ser: 1.06 mg/dL (ref 0.61–1.24)
GFR calc non Af Amer: >60 mL/min (ref >60)
Glucose, Bld: 130 mg/dL — ABNORMAL HIGH (ref 65–99)
POTASSIUM: 3.9 mmol/L (ref 3.5–5.1)
Sodium: 135 mmol/L (ref 135–145)

## 2015-11-21 LAB — CBC WITH DIFFERENTIAL/PLATELET
BASOS ABS: 0 10*3/uL (ref 0.0–0.1)
BASOS PCT: 0 %
Eosinophils Absolute: 0 10*3/uL (ref 0.0–0.7)
Eosinophils Relative: 0 %
HEMATOCRIT: 43.8 % (ref 39.0–52.0)
HEMOGLOBIN: 14.6 g/dL (ref 13.0–17.0)
Lymphocytes Relative: 9 %
Lymphs Abs: 1.4 10*3/uL (ref 0.7–4.0)
MCH: 30.9 pg (ref 26.0–34.0)
MCHC: 33.3 g/dL (ref 30.0–36.0)
MCV: 92.8 fL (ref 78.0–100.0)
Monocytes Absolute: 1.3 10*3/uL — ABNORMAL HIGH (ref 0.1–1.0)
Monocytes Relative: 9 %
NEUTROS PCT: 82 %
Neutro Abs: 11.8 10*3/uL — ABNORMAL HIGH (ref 1.7–7.7)
Platelets: 245 10*3/uL (ref 150–400)
RBC: 4.72 MIL/uL (ref 4.22–5.81)
RDW: 13 % (ref 11.5–15.5)
WBC: 14.5 10*3/uL — ABNORMAL HIGH (ref 4.0–10.5)

## 2015-11-21 NOTE — Progress Notes (Signed)
CSW spoke with pt re: [facility].  CSW called [facility] and confirmed that they have no beds this pm, but may have openings in the am.  Pt will f/u with [facility] in the am and then call ED CSW should [facility] require further information from his medical record.  Pt has verbally authorized ED CSW to communicate [facility] and [facility].  SA lists and emotional support provided to pt.

## 2015-11-21 NOTE — ED Provider Notes (Signed)
[name]: [id number]     Arrival date & time 11/21/15  2009 History   First MD Initiated Contact with Patient 11/21/15 2048     Chief Complaint  Patient presents with  . Herion overdose      (Consider location/radiation/quality/duration/timing/severity/associated sxs/prior Treatment) HPI   Casey Mercer is a 35 y.o. male presents for evaluation of suspected narcotic overdose. EMS called because he was unconscious. They gave him Narcan and he woke up. He admits to using heroin today. He had a jailed 2 days ago. His long-term history of heroin abuse. He denies recent fever, chills, cough, chest pain, or dizziness.   Past Medical History  Diagnosis Date  . Back pain   . Gastric ulcer, chronic   . Depression   . Anxiety   . Chronic pain   . Arthritis   . Bipolar 1 disorder (HCC)   . Panic attacks   . Substance induced mood disorder (HCC) 07/19/2012   Past Surgical History  Procedure Laterality Date  . None     History reviewed. No pertinent family history. Social History  Substance Use Topics  . Smoking status: Current Every Day Smoker -- 1.00 packs/day for 20 years    Types: Cigarettes  . Smokeless tobacco: Never Used  . Alcohol Use: 0.6 oz/week    1 Cans of beer, 0 Standard drinks or equivalent per week    Review of Systems  All other systems reviewed and are negative.     Allergies  Benadryl; Nyquil; Tylenol; and Vistaril  Home Medications   Prior to Admission medications   Medication Sig Start Date End Date Taking? Authorizing Provider  cyclobenzaprine (FLEXERIL) 10 MG tablet Take 1 tablet (10 mg total) by mouth 3 (three) times daily as needed for muscle spasms. 01/19/15   Carmelina Dane, MD  cyclobenzaprine (FLEXERIL) 5 MG tablet 1/2 to 1 tablet by mouth up to every 8 hours as needed. Start at bedtime as needed due to sedation 01/07/15   Shade Flood, MD  ibuprofen (ADVIL,MOTRIN) 800 MG tablet Take 1 tablet (800 mg total) by mouth every 8 (eight) hours as needed  for moderate pain. 05/05/14   Pricilla Loveless, MD  meloxicam (MOBIC) 7.5 MG tablet Take 1-2 tablets (7.5-15 mg total) by mouth daily. Patient not taking: Reported on 01/17/2015 01/07/15   Shade Flood, MD  naproxen sodium (ANAPROX DS) 550 MG tablet Take 1 tablet (550 mg total) by mouth 2 (two) times daily with a meal. 01/11/15 01/11/16  Carmelina Dane, MD  oxyCODONE (OXY IR/ROXICODONE) 5 MG immediate release tablet Take 1 tablet (5 mg total) by mouth every 4 (four) hours as needed for severe pain. 01/19/15   Carmelina Dane, MD  predniSONE (STERAPRED UNI-PAK 48 TAB) 10 MG (48) TBPK tablet Take as directed on package Patient not taking: Reported on 01/19/2015 01/17/15   Carmelina Dane, MD   BP 127/77 mmHg  Pulse 102  Temp(Src) 97.9 F (36.6 C)  Resp 16  Ht 6' (1.829 m)  Wt 162 lb (73.483 kg)  BMI 21.97 kg/m2  SpO2 96% Physical Exam  Constitutional: He is oriented to person, place, and time. He appears well-developed and well-nourished.  HENT:  Head: Normocephalic and atraumatic.  Right Ear: External ear normal.  Left Ear: External ear normal.  Eyes: Conjunctivae and EOM are normal. Pupils are equal, round, and reactive to light.  Neck: Normal range of motion and phonation normal. Neck supple.  Cardiovascular: Normal rate, regular rhythm and  normal heart sounds.   Pulmonary/Chest: Effort normal and breath sounds normal. He exhibits no bony tenderness.  Abdominal: Soft. There is no tenderness.  Musculoskeletal: Normal range of motion.  Neurological: He is alert and oriented to person, place, and time. No cranial nerve deficit or sensory deficit. He exhibits normal muscle tone. Coordination normal.  Skin: Skin is warm, dry and intact.  Psychiatric: He has a normal mood and affect. His behavior is normal. Judgment and thought content normal.  Nursing note and vitals reviewed.   ED Course  Procedures (including critical care time)   Medications - No data to display  Patient  Vitals for the past 24 hrs:  BP Temp Pulse Resp SpO2 Height Weight  11/21/15 2109 127/77 mmHg - 102 16 96 % - -  11/21/15 2021 121/86 mmHg 97.9 F (36.6 C) 116 18 96 % 6' (1.829 m) 162 lb (73.483 kg)    11:02 PM Reevaluation with update and discussion. After initial assessment and treatment, an updated evaluation reveals Respiratory distress at this time. He is comfortable. He has discussed his situation with case Production designer, theatre/television/filmmanager and Child psychotherapistsocial worker. Casey Mercer     Labs Review Labs Reviewed  CBC WITH DIFFERENTIAL/PLATELET - Abnormal; Notable for the following:    WBC 14.5 (*)    Neutro Abs 11.8 (*)    Monocytes Absolute 1.3 (*)    All other components within normal limits  BASIC METABOLIC PANEL - Abnormal; Notable for the following:    Glucose, Bld 130 (*)    BUN 21 (*)    All other components within normal limits    Imaging Review No results found. I have personally reviewed and evaluated these images and lab results as part of my medical decision-making.   EKG Interpretation   Date/Time:  Monday November 21 2015 20:27:36 EDT Ventricular Rate:  117 PR Interval:  138 QRS Duration: 74 QT Interval:  304 QTC Calculation: 424 R Axis:   64 Text Interpretation:  Sinus tachycardia Cannot rule out Anterior infarct ,  age undetermined Abnormal ECG since last tracing no significant change  Confirmed by [facility]  MD, Everard Interrante [phone] on 11/21/2015 8:47:37 PM      MDM   Final diagnoses:  Heroin overdose, accidental or unintentional, initial encounter    Accidental narcotic overdose, with history of narcotic addiction. No indication for further evaluation or hospitalization at this time.  Nursing Notes Reviewed/ Care Coordinated Applicable Imaging Reviewed Interpretation of Laboratory Data incorporated into ED treatment  The patient appears reasonably screened and/or stabilized for discharge and I doubt any other medical condition or other [facility] requiring further screening, evaluation, or  treatment in the ED at this time prior to discharge.  Plan: Home Medications- none; Home Treatments- rest, avoid heroin; return here if the recommended treatment, does not improve the symptoms; Recommended follow up- PCP prn. [facility] in AM     Mancel BaleElliott Trinidy Masterson, MD 11/21/15 2303

## 2015-11-21 NOTE — ED Notes (Signed)
EDP at bedside  

## 2015-11-21 NOTE — Discharge Instructions (Signed)

## 2015-11-21 NOTE — ED Notes (Signed)
Pt requesting to speak to EDP, EDP aware and at bedside

## 2015-11-21 NOTE — ED Notes (Signed)
Patient presents statin "apparently I was dead"  EMS responded and and Narcan.  Patient would not get in the ambulance so friends brought him here

## 2015-11-22 LAB — URINE MICROSCOPIC-ADD ON: Squamous Epithelial / LPF: NONE SEEN

## 2015-11-22 LAB — URINALYSIS, ROUTINE W REFLEX MICROSCOPIC
BILIRUBIN URINE: NEGATIVE
Glucose, UA: >1000 mg/dL — AB
KETONES UR: NEGATIVE mg/dL
Leukocytes, UA: NEGATIVE
Nitrite: NEGATIVE
Protein, ur: NEGATIVE mg/dL
Specific Gravity, Urine: 1.023 (ref 1.005–1.030)
pH: 5.5 (ref 5.0–8.0)

## 2015-11-22 LAB — RAPID URINE DRUG SCREEN, HOSP PERFORMED
Amphetamines: NOT DETECTED
Barbiturates: NOT DETECTED
Benzodiazepines: POSITIVE — AB
Cocaine: POSITIVE — AB
OPIATES: POSITIVE — AB
Tetrahydrocannabinol: NOT DETECTED

## 2015-12-17 DEATH — deceased
